# Patient Record
Sex: Male | Born: 1990 | Race: White | Hispanic: No | Marital: Married | Smoking: Never smoker
Health system: Southern US, Community
[De-identification: ages and names within clinical notes are randomized; demographics above are authoritative.]

## PROBLEM LIST (undated history)

## (undated) DIAGNOSIS — Z9109 Other allergy status, other than to drugs and biological substances: Secondary | ICD-10-CM

## (undated) HISTORY — PX: KNEE SURGERY: SHX244

---

## 2012-11-07 ENCOUNTER — Encounter (HOSPITAL_BASED_OUTPATIENT_CLINIC_OR_DEPARTMENT_OTHER): Payer: Self-pay | Admitting: *Deleted

## 2012-11-07 ENCOUNTER — Emergency Department (HOSPITAL_BASED_OUTPATIENT_CLINIC_OR_DEPARTMENT_OTHER)
Admission: EM | Admit: 2012-11-07 | Discharge: 2012-11-07 | Disposition: A | Discharge status: Home / Self Care | Payer: Managed Care, Other (non HMO) | Attending: Emergency Medicine | Admitting: Emergency Medicine

## 2012-11-07 ENCOUNTER — Other Ambulatory Visit: Payer: Self-pay

## 2012-11-07 DIAGNOSIS — S39012A Strain of muscle, fascia and tendon of lower back, initial encounter: Secondary | ICD-10-CM

## 2012-11-07 DIAGNOSIS — Y9389 Activity, other specified: Secondary | ICD-10-CM | POA: Insufficient documentation

## 2012-11-07 DIAGNOSIS — Y9289 Other specified places as the place of occurrence of the external cause: Secondary | ICD-10-CM | POA: Insufficient documentation

## 2012-11-07 DIAGNOSIS — IMO0002 Reserved for concepts with insufficient information to code with codable children: Secondary | ICD-10-CM | POA: Insufficient documentation

## 2012-11-07 MED ORDER — CYCLOBENZAPRINE HCL 10 MG PO TABS: 10.0000 mg | ORAL_TABLET | Freq: Two times a day (BID) | ORAL | Status: DC | PRN

## 2012-11-07 MED ORDER — IBUPROFEN 800 MG PO TABS
800.0000 mg | ORAL_TABLET | Freq: Once | ORAL | Status: AC
  Administered 2012-11-07: 800 mg via ORAL
  Filled 2012-11-07: qty 1
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
## 2012-11-07 NOTE — ED Provider Notes (Signed)
Medical screening examination/treatment/procedure(s) were performed by non-physician practitioner and as supervising physician I was immediately available for consultation/collaboration.   Charles B. Sheldon, MD 11/07/12 2118 

## 2012-11-21 ENCOUNTER — Emergency Department (HOSPITAL_BASED_OUTPATIENT_CLINIC_OR_DEPARTMENT_OTHER): Payer: Managed Care, Other (non HMO)

## 2012-11-21 ENCOUNTER — Encounter (HOSPITAL_BASED_OUTPATIENT_CLINIC_OR_DEPARTMENT_OTHER): Payer: Self-pay | Admitting: *Deleted

## 2012-11-21 ENCOUNTER — Emergency Department (HOSPITAL_BASED_OUTPATIENT_CLINIC_OR_DEPARTMENT_OTHER)
Admission: EM | Admit: 2012-11-21 | Discharge: 2012-11-21 | Disposition: A | Discharge status: Home / Self Care | Payer: Managed Care, Other (non HMO) | Attending: Emergency Medicine | Admitting: Emergency Medicine

## 2012-11-21 DIAGNOSIS — R059 Cough, unspecified: Secondary | ICD-10-CM | POA: Insufficient documentation

## 2012-11-21 DIAGNOSIS — N453 Epididymo-orchitis: Secondary | ICD-10-CM | POA: Insufficient documentation

## 2012-11-21 DIAGNOSIS — N451 Epididymitis: Secondary | ICD-10-CM

## 2012-11-21 DIAGNOSIS — R05 Cough: Secondary | ICD-10-CM | POA: Insufficient documentation

## 2012-11-21 DIAGNOSIS — R109 Unspecified abdominal pain: Secondary | ICD-10-CM | POA: Insufficient documentation

## 2012-11-21 LAB — URINALYSIS, ROUTINE W REFLEX MICROSCOPIC
Protein, ur: NEGATIVE mg/dL
Specific Gravity, Urine: 1.018 (ref 1.005–1.030)
Urobilinogen, UA: 0.2 mg/dL (ref 0.0–1.0)

## 2012-11-21 LAB — URINE MICROSCOPIC-ADD ON

## 2012-11-21 IMAGING — US US SCROTUM
1 series · 14 of 25 positions shown · non-contrast
Comparison: none

CLINICAL DATA: Right testicle pain.  Rule out torsion

SCROTAL ULTRASOUND
DOPPLER ULTRASOUND OF THE TESTICLES
TECHNIQUE: Complete ultrasound examination of the testicles,
epididymis, and other scrotal structures was performed.  Color and
spectral Doppler ultrasound were also utilized to evaluate blood
flow to the testicles.

[Series 1: us scrotum · 0.08mm/px · 14 of 31 slices shown]
[im 1/31]
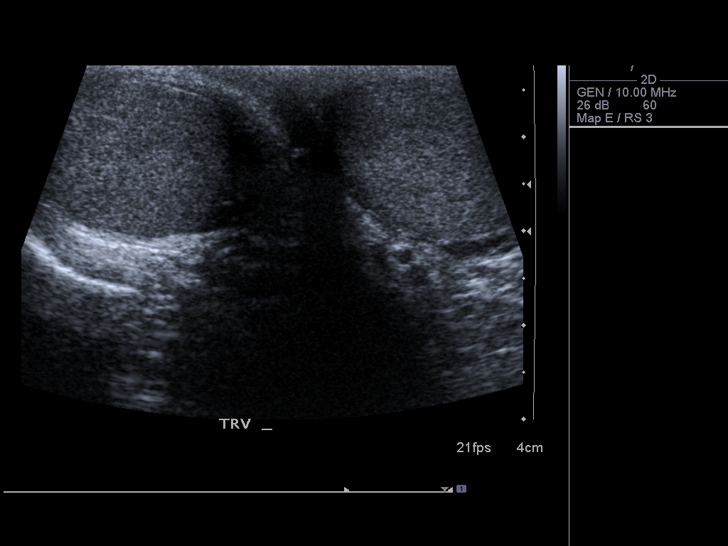
[im 3/31]
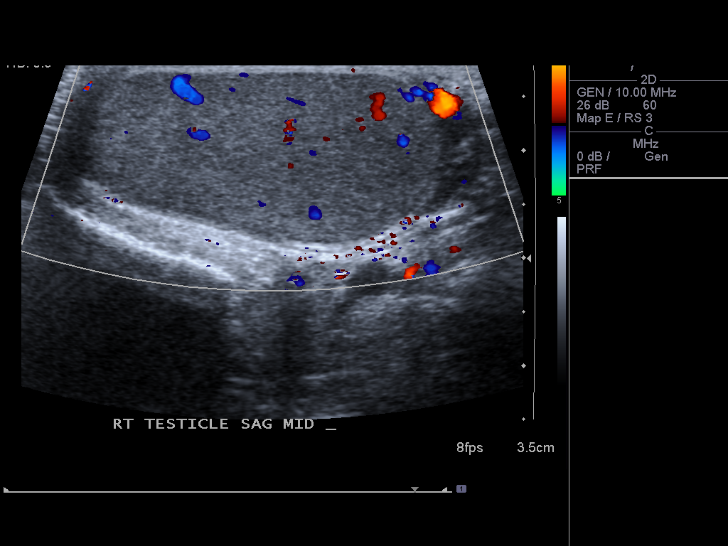
[im 6/31]
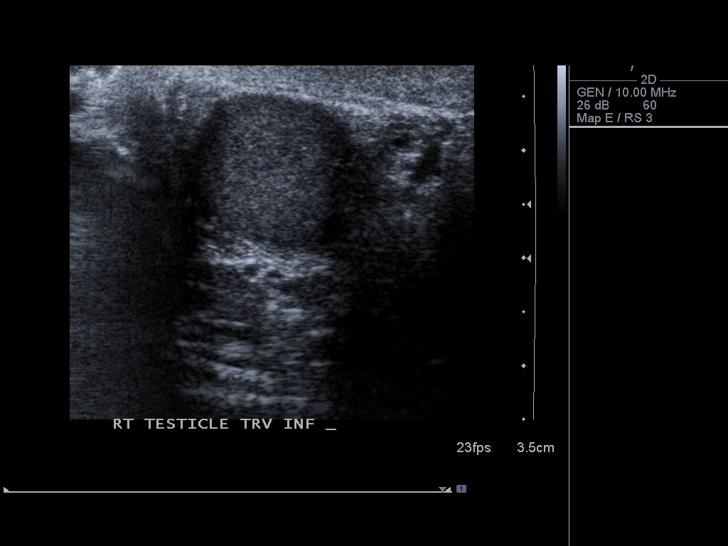
[im 8/31]
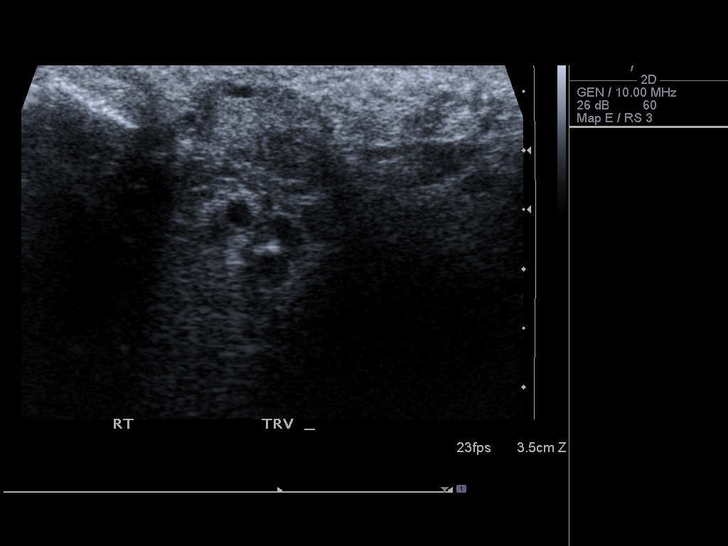
[im 11/31]
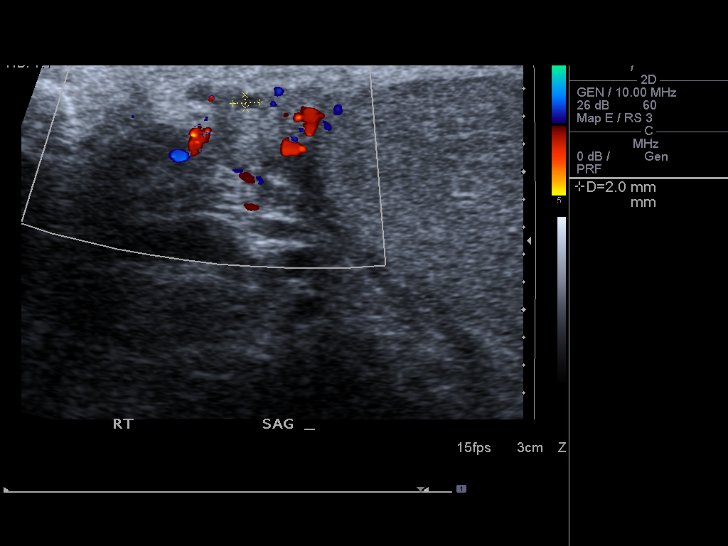
[im 12/31]
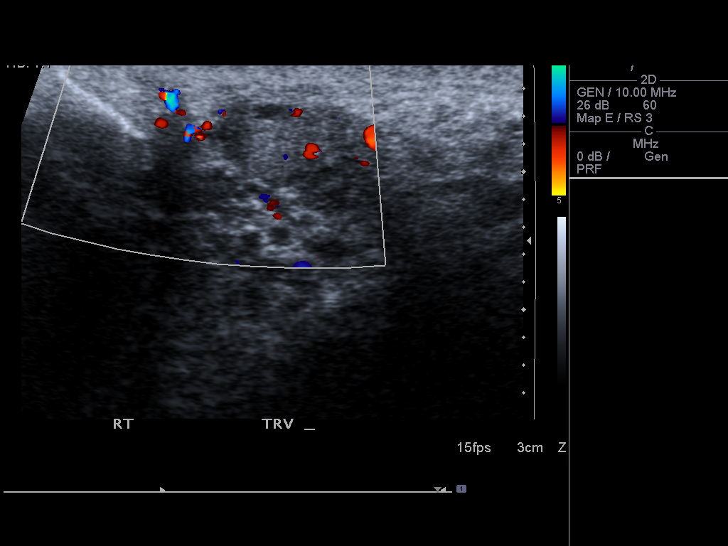
[im 14/31]
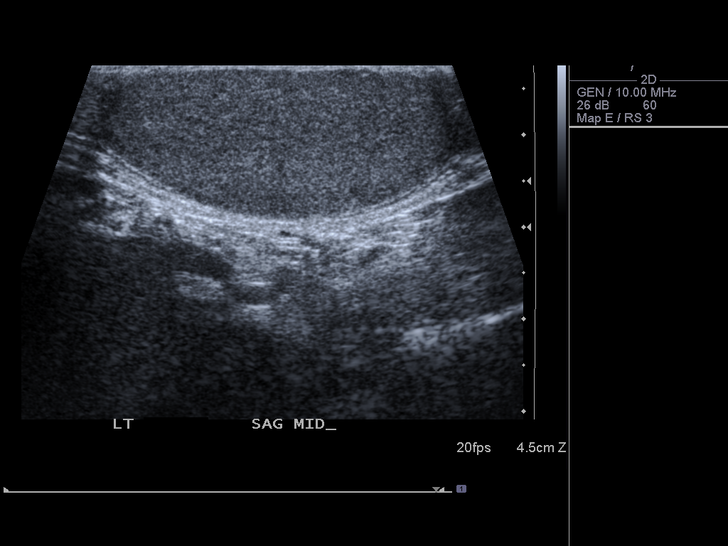
[im 17/31]
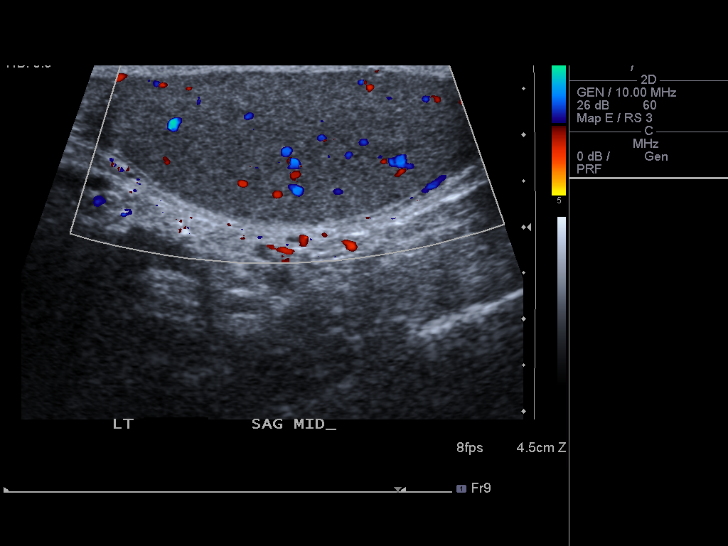
[im 19/31]
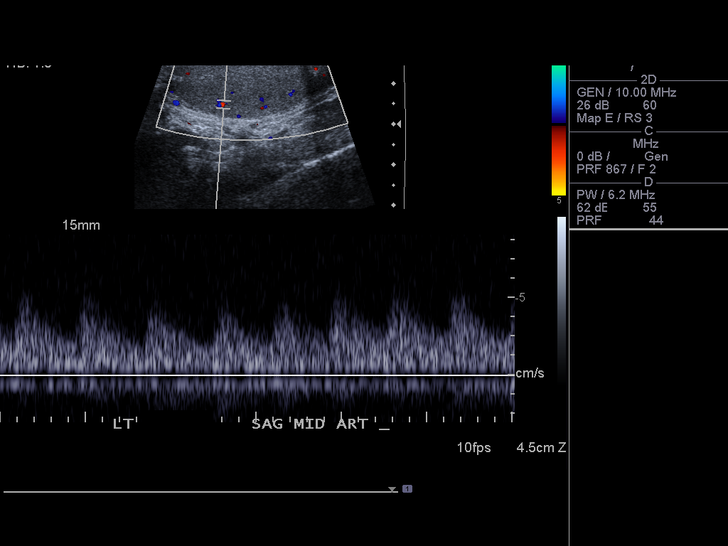
[im 21/31]
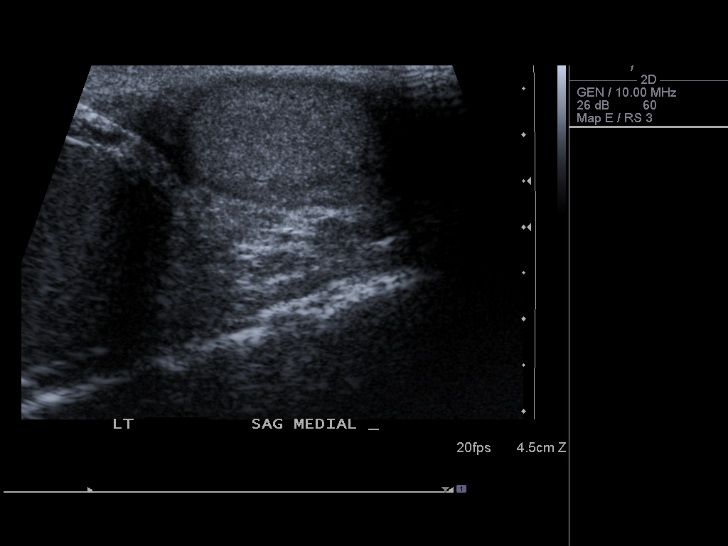
[im 23/31]
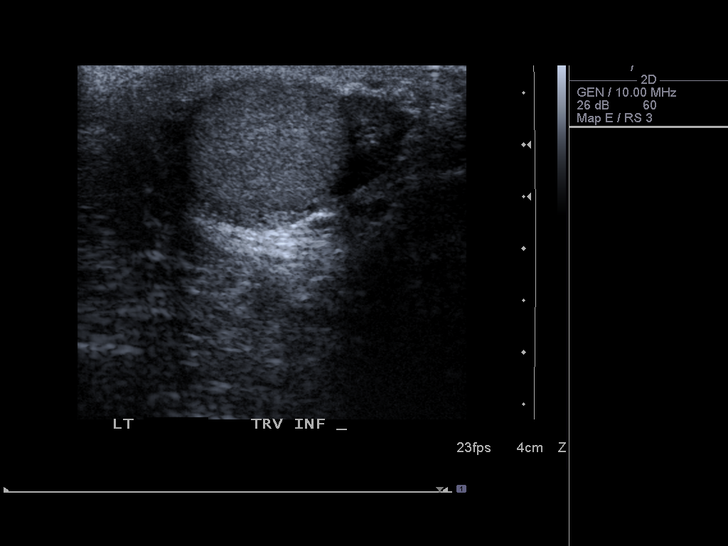
[im 26/31]
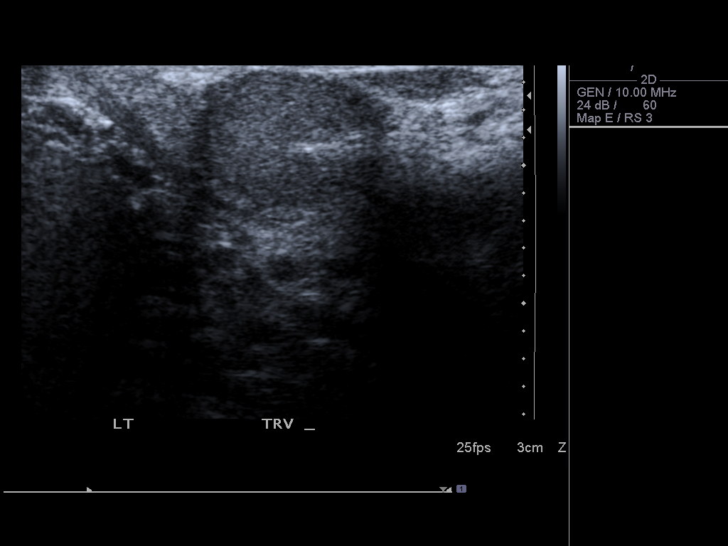
[im 28/31]
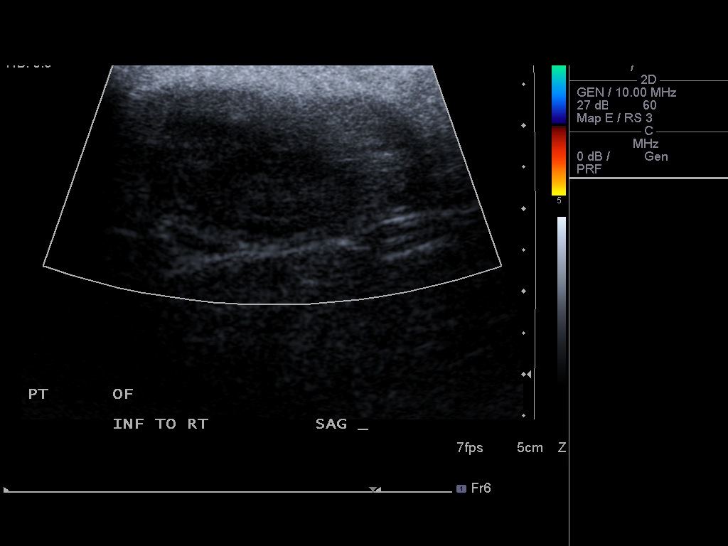
[im 31/31]
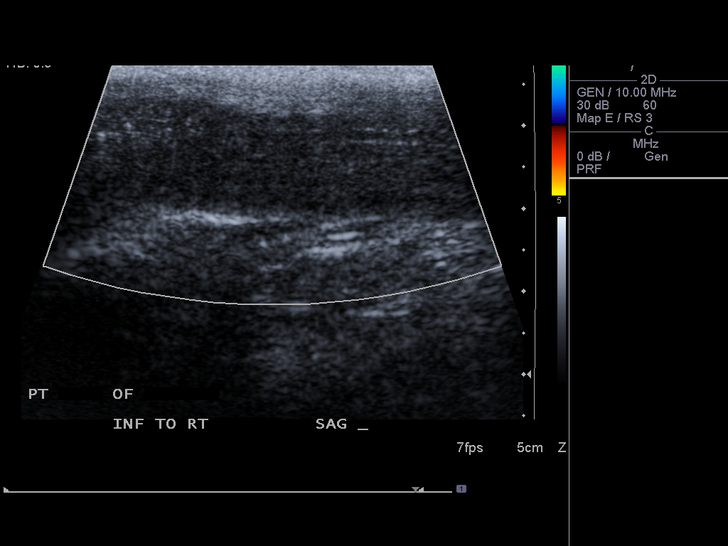

[14 of 25 positions shown; findings below may reference images not displayed]

FINDINGS: Right testis:  3.5 x 1.6 x 2.8 cm.  No mass lesion.  Normal blood
flow by Doppler.

Left testis:  3.8 x 1.7 x 2.8 cm.  Negative for mass.  Normal blood
flow by Doppler.

Right epididymis:  Normal in size and appearance.

Left epididymis:  Normal in size and appearance.

Hydrocele:  Negative

Varicocele:  Negative

Pulsed Doppler interrogation of both testes demonstrates low
resistance flow bilaterally.
IMPRESSION: Negative

## 2012-11-21 MED ORDER — CIPROFLOXACIN HCL 500 MG PO TABS: 500.0000 mg | ORAL_TABLET | Freq: Two times a day (BID) | ORAL | Status: DC

## 2012-11-21 NOTE — ED Provider Notes (Signed)
History    Scribed for Justin Bucco, MD, the patient was seen in room MH07/MH07. This chart was scribed by Lewanda Rife, ED scribe. Patient's care was started at    [facility]: [id number]  Arrival date & time 11/21/12  1821   First MD Initiated Contact with Patient 11/21/12 1843      Chief Complaint  Patient presents with  . Testicle Pain    (Consider location/radiation/quality/duration/timing/severity/associated sxs/prior treatment) The history is provided by the patient.   Justin Ochoa is a 22 y.o. male who presents to the Emergency Department complaining of constant, moderate and worsening right testicular pain onset acute yesterday 11 pm. Pt reports pain is persistent and aggravated on palpation. Pt states suprapubic tenderness is reproduced when right testicle is palpated. Pt additionally reports mild cough. Pt denies penile discharge, recent injury, rash, penile pain, hematuria, dysuria, and fever. Pt denies hx of hernia. Pt denies being sexually active and no hx of STD's. Pt denies taking any medications at home PTA for pain. Pt reports hx of prostatitis.    History reviewed. No pertinent past medical history.  History reviewed. No pertinent past surgical history.  No family history on file.  History  Substance Use Topics  . Smoking status: Never Smoker   . Smokeless tobacco: Not on file  . Alcohol Use: Yes      Review of Systems  Constitutional: Negative.   HENT: Negative.   Respiratory: Positive for cough.   Cardiovascular: Negative.   Gastrointestinal: Positive for abdominal pain (suprapubic). Negative for vomiting and diarrhea.  Genitourinary: Positive for testicular pain.  Musculoskeletal: Negative.   Skin: Negative.   Neurological: Negative.   Psychiatric/Behavioral: Negative.   All other systems reviewed and are negative.   A complete 10 system review of systems was obtained and all systems are negative except as noted in the HPI and PMH.    Allergies   Review of patient's allergies indicates no known allergies.  Home Medications   Current Outpatient Rx  Name  Route  Sig  Dispense  Refill  . ciprofloxacin (CIPRO) 500 MG tablet   Oral   Take 1 tablet (500 mg total) by mouth 2 (two) times daily. One po bid x 10 days   20 tablet   0   . cyclobenzaprine (FLEXERIL) 10 MG tablet   Oral   Take 1 tablet (10 mg total) by mouth 2 (two) times daily as needed for muscle spasms.   20 tablet   0     BP 136/84  Pulse 80  Temp(Src) 98.2 F (36.8 C) (Oral)  Resp 18  Wt 140 lb (63.504 kg)  SpO2 100%  Physical Exam  Nursing note and vitals reviewed. Constitutional: He is oriented to person, place, and time. He appears well-developed and well-nourished.  HENT:  Head: Normocephalic and atraumatic.  Eyes: Pupils are equal, round, and reactive to light.  Neck: Normal range of motion. Neck supple.  Cardiovascular: Normal rate, regular rhythm and normal heart sounds.   Pulmonary/Chest: Effort normal and breath sounds normal. No respiratory distress. He has no wheezes. He has no rales. He exhibits no tenderness.  Abdominal: Soft. Bowel sounds are normal. There is tenderness (mild suprapubic). There is no rebound and no guarding. Hernia confirmed negative in the right inguinal area and confirmed negative in the left inguinal area.  Genitourinary: Right testis shows swelling (mild) and tenderness. Right testis shows no mass. Cremasteric reflex is not absent on the right side. Left testis shows no mass, no  swelling and no tenderness. Cremasteric reflex is not absent on the left side. Circumcised.  Pain over right epididymus.   Musculoskeletal: Normal range of motion. He exhibits no edema.  Lymphadenopathy:    He has no cervical adenopathy.       Right: No inguinal adenopathy present.       Left: No inguinal adenopathy present.  Neurological: He is alert and oriented to person, place, and time.  Skin: Skin is warm and dry. No rash noted.   Psychiatric: He has a normal mood and affect.    ED Course  Procedures (including critical care time) Medications - No data to display  Results for orders placed during the hospital encounter of 11/21/12  URINALYSIS, ROUTINE W REFLEX MICROSCOPIC      Result Value Range   Color, Urine YELLOW  YELLOW   APPearance CLEAR  CLEAR   Specific Gravity, Urine 1.018  1.005 - 1.030   pH 6.5  5.0 - 8.0   Glucose, UA NEGATIVE  NEGATIVE mg/dL   Hgb urine dipstick NEGATIVE  NEGATIVE   Bilirubin Urine NEGATIVE  NEGATIVE   Ketones, ur NEGATIVE  NEGATIVE mg/dL   Protein, ur NEGATIVE  NEGATIVE mg/dL   Urobilinogen, UA 0.2  0.0 - 1.0 mg/dL   Nitrite NEGATIVE  NEGATIVE   Leukocytes, UA TRACE (*) NEGATIVE  URINE MICROSCOPIC-ADD ON      Result Value Range   Squamous Epithelial / LPF RARE  RARE   WBC, UA 0-2  <3 WBC/hpf   Bacteria, UA RARE  RARE   US Scrotum  11/21/2012   *RADIOLOGY REPORT*  Clinical Data:  Right testicle pain.  Rule out torsion  SCROTAL ULTRASOUND DOPPLER ULTRASOUND OF THE TESTICLES  Technique: Complete ultrasound examination of the testicles, epididymis, and other scrotal structures was performed.  Color and spectral Doppler ultrasound were also utilized to evaluate blood flow to the testicles.  Comparison:  none  Findings:  Right testis:  3.5 x 1.6 x 2.8 cm.  No mass lesion.  Normal blood flow by Doppler.  Left testis:  3.8 x 1.7 x 2.8 cm.  Negative for mass.  Normal blood flow by Doppler.  Right epididymis:  Normal in size and appearance.  Left epididymis:  Normal in size and appearance.  Hydrocele:  Negative  Varicocele:  Negative  Pulsed Doppler interrogation of both testes demonstrates low resistance flow bilaterally.  IMPRESSION: Negative   Original Report Authenticated By: Janeece Riggers, M.D.    [place] Art/ven Flow Abd Pelv Doppler  11/21/2012   *RADIOLOGY REPORT*  Clinical Data:  Right testicle pain.  Rule out torsion  SCROTAL ULTRASOUND DOPPLER ULTRASOUND OF THE TESTICLES  Technique:  Complete ultrasound examination of the testicles, epididymis, and other scrotal structures was performed.  Color and spectral Doppler ultrasound were also utilized to evaluate blood flow to the testicles.  Comparison:  none  Findings:  Right testis:  3.5 x 1.6 x 2.8 cm.  No mass lesion.  Normal blood flow by Doppler.  Left testis:  3.8 x 1.7 x 2.8 cm.  Negative for mass.  Normal blood flow by Doppler.  Right epididymis:  Normal in size and appearance.  Left epididymis:  Normal in size and appearance.  Hydrocele:  Negative  Varicocele:  Negative  Pulsed Doppler interrogation of both testes demonstrates low resistance flow bilaterally.  IMPRESSION: Negative   Original Report Authenticated By: Janeece Riggers, M.D.       1. Epididymitis       MDM  No  evidence of torsion.  Pt says that he is allergic to doxycycline.  Denies being sexually active.  Will tx with cipro.  Advised to f/u with urologist if symptoms not improving    I personally performed the services described in this documentation, which was scribed in my presence.  The recorded information has been reviewed and considered.      Justin Bucco, MD 11/21/12 2008

## 2012-11-21 NOTE — ED Notes (Signed)
Right testicular pain since yesterday.

## 2015-05-15 ENCOUNTER — Emergency Department (INDEPENDENT_AMBULATORY_CARE_PROVIDER_SITE_OTHER): Payer: Managed Care, Other (non HMO)

## 2015-05-15 ENCOUNTER — Encounter (HOSPITAL_COMMUNITY): Payer: Self-pay | Admitting: Emergency Medicine

## 2015-05-15 ENCOUNTER — Emergency Department (HOSPITAL_COMMUNITY)
Admission: EM | Admit: 2015-05-15 | Discharge: 2015-05-15 | Disposition: A | Discharge status: Home / Self Care | Payer: Managed Care, Other (non HMO) | Source: Home / Self Care | Attending: Emergency Medicine | Admitting: Emergency Medicine

## 2015-05-15 DIAGNOSIS — R109 Unspecified abdominal pain: Secondary | ICD-10-CM

## 2015-05-15 DIAGNOSIS — K5900 Constipation, unspecified: Secondary | ICD-10-CM

## 2015-05-15 LAB — POCT URINALYSIS DIP (DEVICE)
Bilirubin Urine: NEGATIVE
GLUCOSE, UA: NEGATIVE mg/dL
Hgb urine dipstick: NEGATIVE
Ketones, ur: NEGATIVE mg/dL
LEUKOCYTES UA: NEGATIVE
Nitrite: NEGATIVE
Protein, ur: NEGATIVE mg/dL
SPECIFIC GRAVITY, URINE: 1.01 (ref 1.005–1.030)
UROBILINOGEN UA: 0.2 mg/dL (ref 0.0–1.0)
pH: 7 (ref 5.0–8.0)

## 2015-05-15 IMAGING — DX DG ABDOMEN 1V
1 series · 1 of 1 positions shown · non-contrast
Comparison: None.

CLINICAL DATA: Left lower quadrant pain for 3 days

EXAM:
ABDOMEN - 1 VIEW

[abdomen kub]
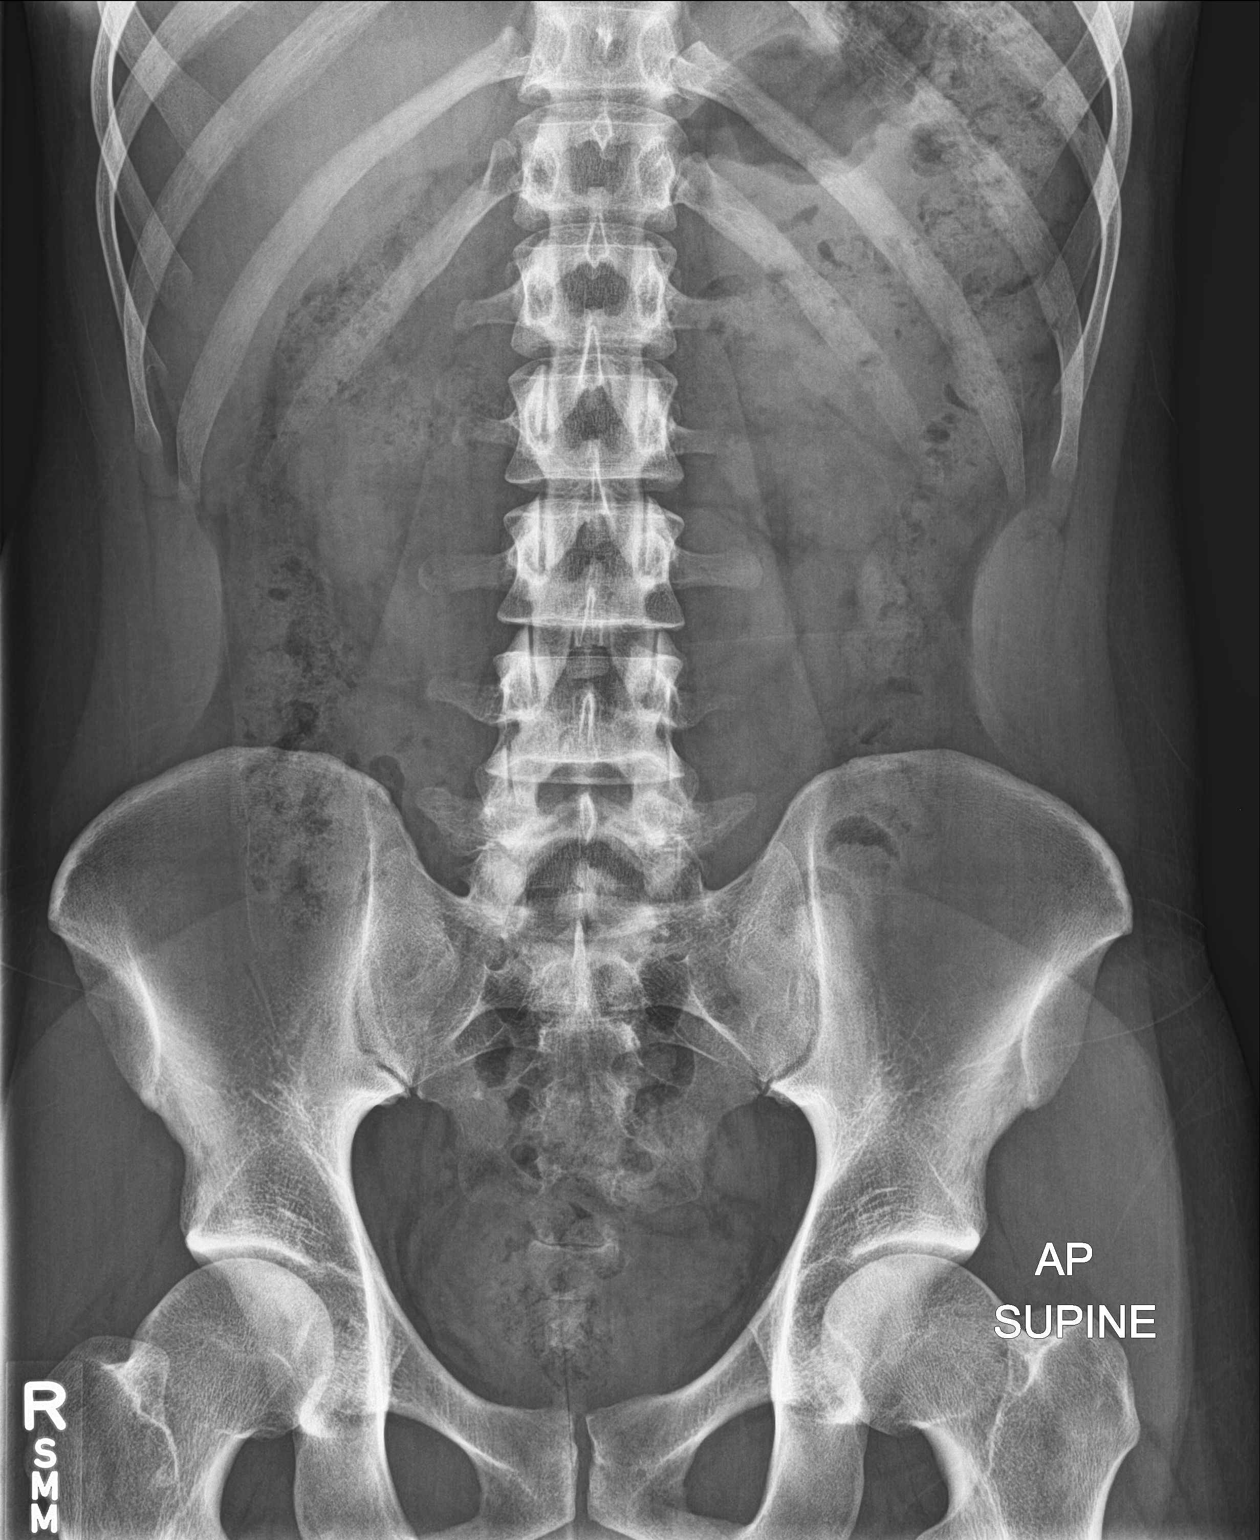

[1 of 1 positions shown; findings below may reference images not displayed]

FINDINGS: Stool throughout the colon. No abnormally dilated loops of bowel. No
abnormal opacities.
IMPRESSION: Constipation

## 2015-05-15 MED ORDER — IBUPROFEN 800 MG PO TABS: 800.0000 mg | ORAL_TABLET | Freq: Three times a day (TID) | ORAL | Status: DC | PRN

## 2015-05-15 MED ORDER — KETOROLAC TROMETHAMINE 60 MG/2ML IM SOLN
INTRAMUSCULAR | Status: AC
  Filled 2015-05-15: qty 2

## 2015-05-15 MED ORDER — POLYETHYLENE GLYCOL 3350 17 GM/SCOOP PO POWD
17.0000 g | Freq: Three times a day (TID) | ORAL | Status: DC
Start: 2015-05-15 — End: 2022-07-28

## 2015-05-15 MED ORDER — KETOROLAC TROMETHAMINE 60 MG/2ML IM SOLN
60.0000 mg | Freq: Once | INTRAMUSCULAR | Status: AC
  Administered 2015-05-15: 60 mg via INTRAMUSCULAR

## 2015-05-15 NOTE — Discharge Instructions (Signed)
The x-ray shows significant constipation. This may be causing your pain. Take ibuprofen 800 mg 3 times a day as needed for pain. This will not worsen constipation. Take MiraLAX 3 times a day until you are having diarrhea. If the constipation is causing your pain, you should see improvement in the next 2 days. If your symptoms do not improve or your pain acutely worsens, please go to the emergency room.

## 2015-05-15 NOTE — ED Notes (Signed)
Pt has been suffering from left sided back pain since Monday.  He states the pain will come and go, but it is getting progressively worse.  He denies any injury or fever.

## 2015-05-15 NOTE — ED Provider Notes (Signed)
CSN: [id number]     Arrival date & time 05/15/15  1940 History   First MD Initiated Contact with Patient 05/15/15 1957     Chief Complaint  Patient presents with  . Back Pain   (Consider location/radiation/quality/duration/timing/severity/associated sxs/prior Treatment) HPI  He is a 24 year old man here for evaluation of left back pain. He states on Monday evening he had an episode of left-sided back pain. It resolved overnight. Then, today around 5 PM it came back. He states it is more intense this time. It is always uncomfortable, but will have waves of more severe pain that last a few seconds. He states it feels like he has to move around, but cannot get comfortable. Pain is not worse with movement. He states he can press in that area without discomfort. The pain wraps around to the left side. He denies any abdominal pain. He denies any nausea or vomiting. No pain with urination or hematuria. He does report increased urinary frequency. No fevers or chills.  He denies any injury or trauma. No new activities or heavy lifting. He denies any history of constipation. Last bowel movement was yesterday and described as normal.  History reviewed. No pertinent past medical history. History reviewed. No pertinent past surgical history. History reviewed. No pertinent family history. Social History  Substance Use Topics  . Smoking status: Never Smoker   . Smokeless tobacco: None  . Alcohol Use: Yes    Review of Systems As in history of present illness Allergies  Review of patient's allergies indicates no known allergies.  Home Medications   Prior to Admission medications   Medication Sig Start Date End Date Taking? Authorizing Provider  ibuprofen (ADVIL,MOTRIN) 800 MG tablet Take 1 tablet (800 mg total) by mouth every 8 (eight) hours as needed for moderate pain. 05/15/15   Charm Rings, MD  polyethylene glycol powder (GLYCOLAX/MIRALAX) powder Take 17 g by mouth 3 (three) times daily. 05/15/15   Charm Rings, MD   Meds Ordered and Administered this Visit   Medications  ketorolac (TORADOL) injection 60 mg (not administered)    BP 148/93 mmHg  Pulse 73  Temp(Src) 98.2 F (36.8 C) (Oral)  Resp 16  SpO2 100% No data found.   Physical Exam  Constitutional: He is oriented to person, place, and time. He appears well-developed and well-nourished. He appears distressed (appears a little uncomfortable).  Neck: Neck supple.  Cardiovascular: Normal rate.   Pulmonary/Chest: Effort normal.  Abdominal: Soft. Bowel sounds are normal. He exhibits no distension and no mass. There is no tenderness. There is no rebound and no guarding.  Musculoskeletal:  Back: No erythema or edema. No vertebral tenderness or step-offs. No point tenderness or muscle spasm appreciated.  Neurological: He is alert and oriented to person, place, and time.    ED Course  Procedures (including critical care time)  Labs Review Labs Reviewed  POCT URINALYSIS DIP (DEVICE)    Imaging Review Dg Abd 1 View  05/15/2015   CLINICAL DATA:  Left lower quadrant pain for 3 days  EXAM: ABDOMEN - 1 VIEW  COMPARISON:  None.  FINDINGS: Stool throughout the colon. No abnormally dilated loops of bowel. No abnormal opacities.  IMPRESSION: Constipation   Electronically Signed   By: Esperanza Heir M.D.   On: 05/15/2015 20:51     MDM   1. Left flank pain   2. Constipation, unspecified constipation type    X-ray shows constipation.  No hematuria or radiopaque body to suggest kidney  stone. No injury or palpable tenderness to suggest musculoskeletal etiology. We'll treat with MiraLAX and ibuprofen. Toradol given here. If symptoms do not improve in the next 2 days or acutely worsen, he will go to the emergency room.    Charm Rings, MD 05/15/15 2107

## 2016-09-22 DIAGNOSIS — R1031 Right lower quadrant pain: Secondary | ICD-10-CM | POA: Diagnosis not present

## 2016-10-21 ENCOUNTER — Ambulatory Visit
Admission: RE | Admit: 2016-10-21 | Discharge: 2016-10-21 | Disposition: A | Discharge status: Home / Self Care | Payer: 59 | Source: Ambulatory Visit | Attending: Family Medicine | Admitting: Family Medicine

## 2016-10-21 ENCOUNTER — Other Ambulatory Visit: Payer: Self-pay | Admitting: Family Medicine

## 2016-10-21 DIAGNOSIS — R1031 Right lower quadrant pain: Secondary | ICD-10-CM | POA: Diagnosis not present

## 2016-10-21 IMAGING — CT CT ABD-PELV W/ CM
3 of 4 series · 12 of 36 positions shown, 19 images · IV contrast (READICAT/WATER & [ID] ISOVUE 300)
Comparison: None.

CLINICAL DATA: Right lower quadrant pain for 2.5 months.

EXAM:
CT ABDOMEN AND PELVIS WITH CONTRAST
TECHNIQUE: Multidetector CT imaging of the abdomen and pelvis was performed
using the standard protocol following bolus administration of
intravenous contrast.
CONTRAST:  100 mL [1Z]

[Series 3: abd/pelvis with · axial · 0.70mm/px · z∈[-422,-87]mm · 9 of 85 slices shown, 15 images]
[im 9/85  soft-tissue]
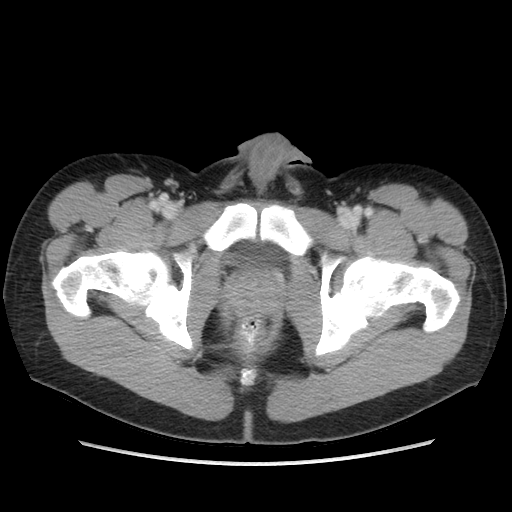
[im 9/85  bone]
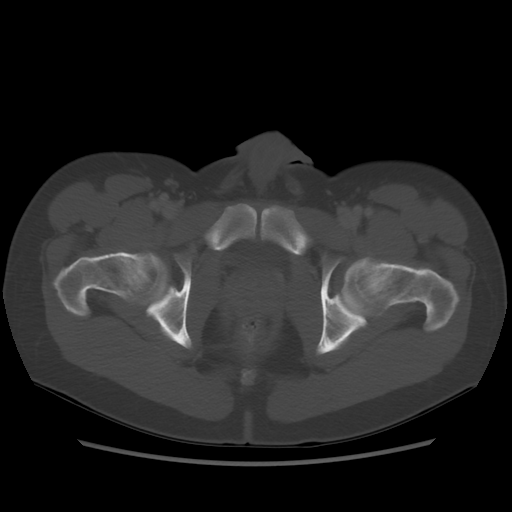
[im 17/85  soft-tissue]
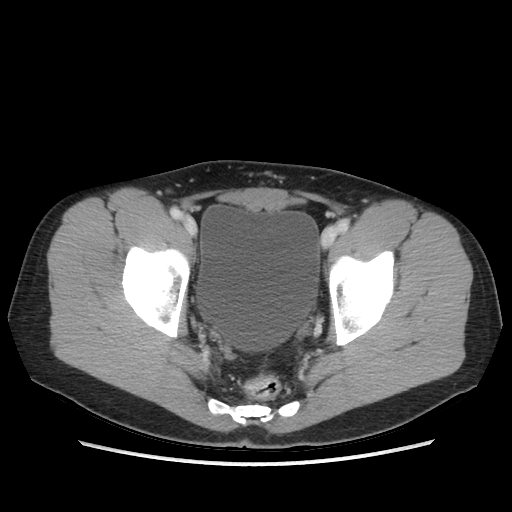
[im 26/85  soft-tissue]
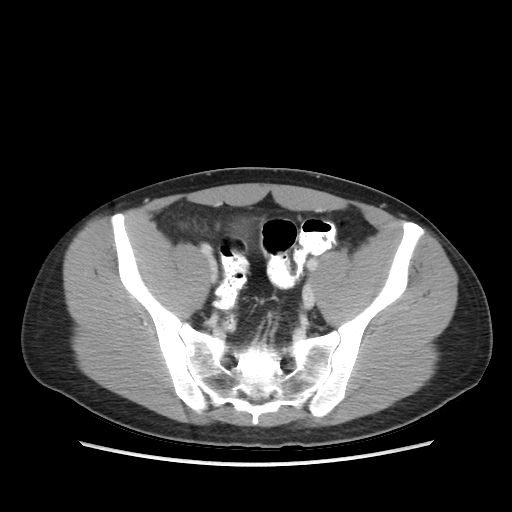
[im 34/85  soft-tissue]
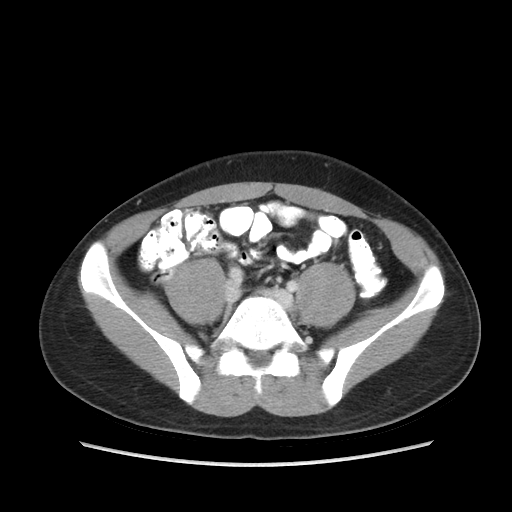
[im 43/85  soft-tissue]
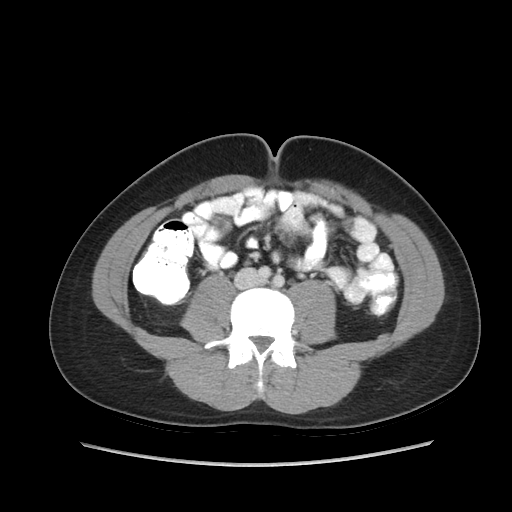
[im 51/85  soft-tissue]
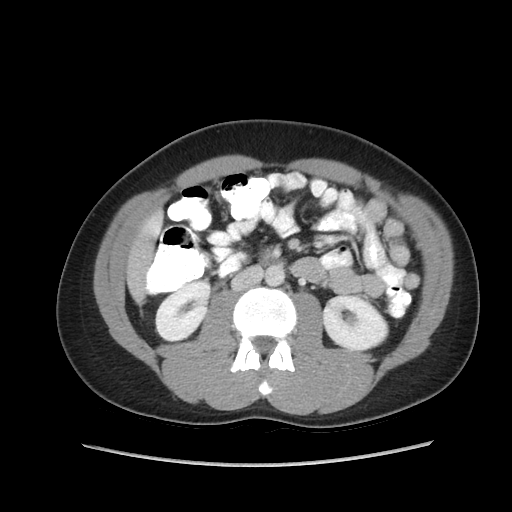
[im 51/85  lung]
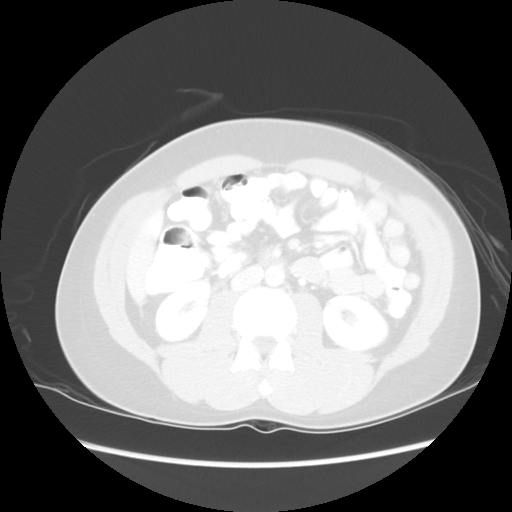
[im 59/85  soft-tissue]
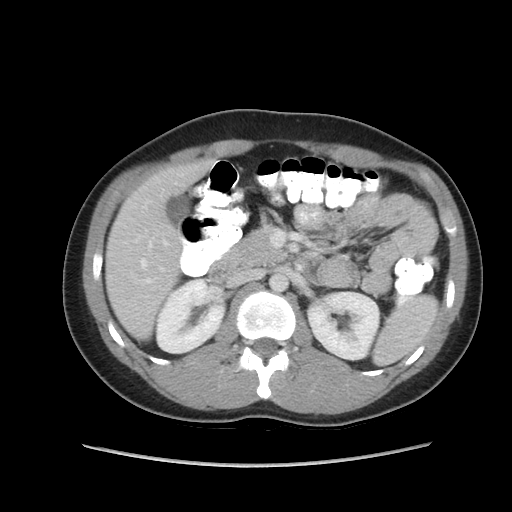
[im 59/85  lung]
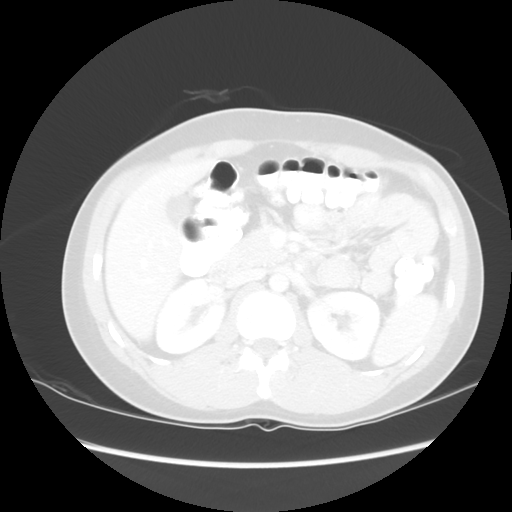
[im 68/85  soft-tissue]
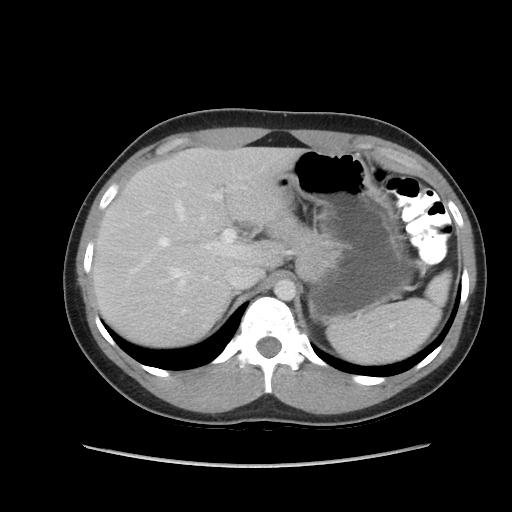
[im 68/85  lung]
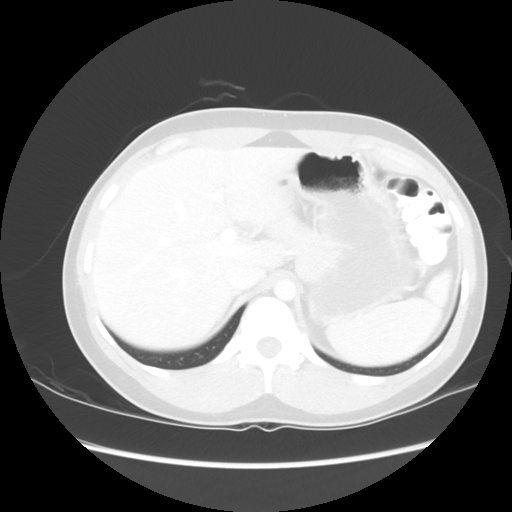
[im 76/85  soft-tissue]
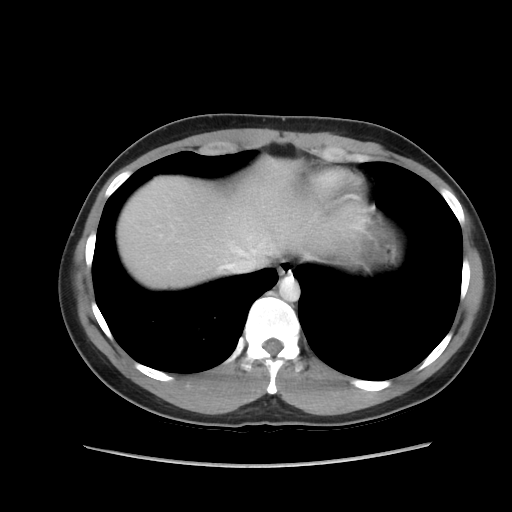
[im 76/85  lung]
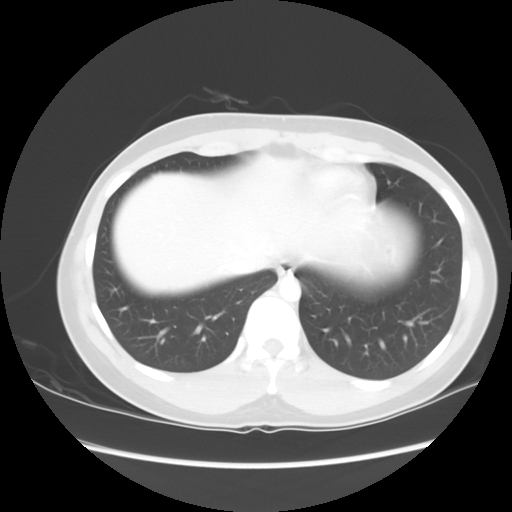
[im 76/85  bone]
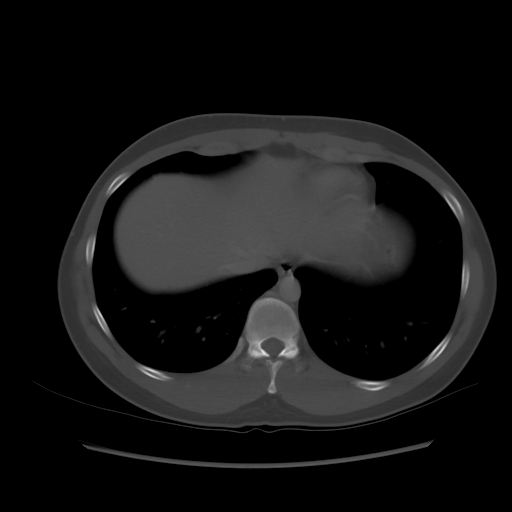

[Series 601: coronal body · coronal · 0.87mm/px · 1 of 99 slices shown, 2 images]
[im 33/99  soft-tissue]
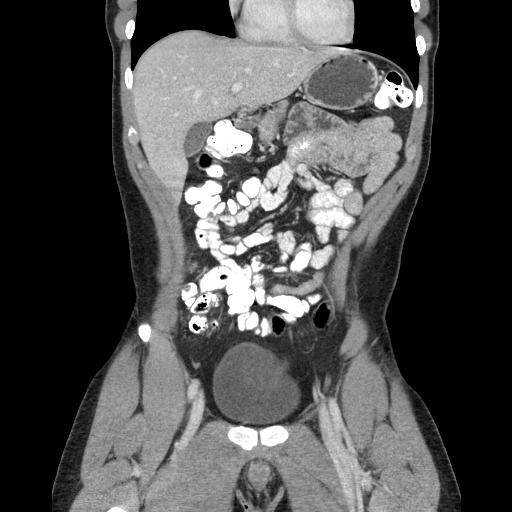
[im 33/99  bone]
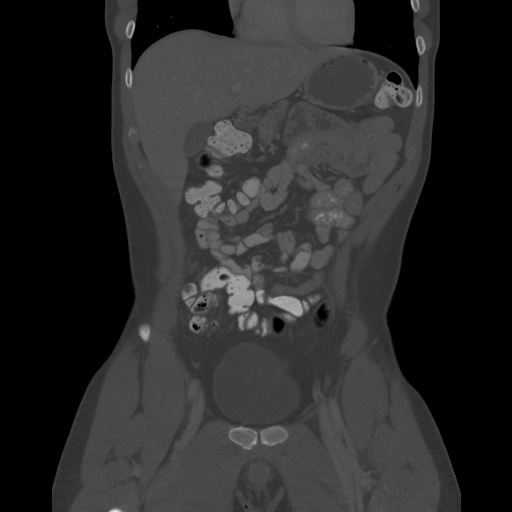

[Series 602: sagittal body · sagittal · 0.87mm/px · 2 of 145 slices shown]
[im 17/145  soft-tissue]
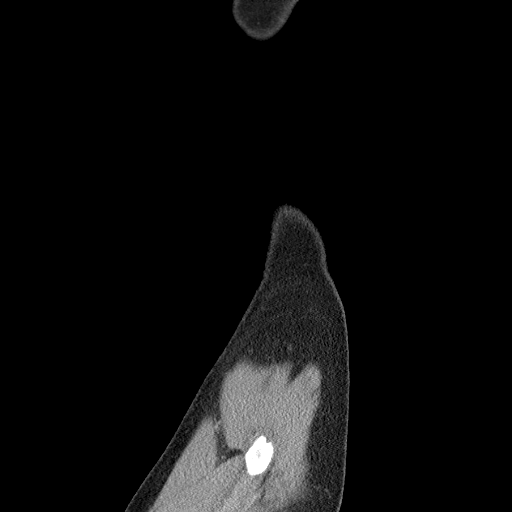
[im 34/145  soft-tissue]
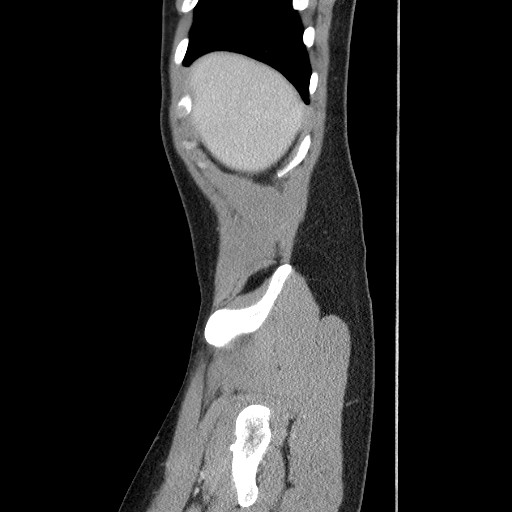

[12 of 36 positions shown; findings below may reference images not displayed]

FINDINGS: Lower chest: No acute abnormality.

Hepatobiliary: No focal liver abnormality is seen. No gallstones,
gallbladder wall thickening, or biliary dilatation.

Pancreas: Unremarkable. No pancreatic ductal dilatation or
surrounding inflammatory changes.

Spleen: Normal in size without focal abnormality.

Adrenals/Urinary Tract: Adrenal glands are unremarkable. Kidneys are
normal, without renal calculi, focal lesion, or hydronephrosis.
Bladder is unremarkable.

Stomach/Bowel: Stomach is within normal limits. Appendix appears
normal. No evidence of bowel wall thickening, distention, or
inflammatory changes.

Vascular/Lymphatic: No significant vascular findings are present. No
enlarged abdominal or pelvic lymph nodes.

Reproductive: Prostate is unremarkable.

Other: No abdominal wall hernia or abnormality. No abdominopelvic
ascites.

Musculoskeletal: No acute or significant osseous findings.
IMPRESSION: 1. No acute abdominal or pelvic pathology.

## 2016-10-21 MED ORDER — IOPAMIDOL (ISOVUE-300) INJECTION 61%
100.0000 mL | Freq: Once | INTRAVENOUS | Status: AC | PRN
  Administered 2016-10-21: 100 mL via INTRAVENOUS

## 2016-11-04 DIAGNOSIS — R1031 Right lower quadrant pain: Secondary | ICD-10-CM | POA: Diagnosis not present

## 2016-11-04 DIAGNOSIS — M545 Low back pain: Secondary | ICD-10-CM | POA: Diagnosis not present

## 2016-12-15 DIAGNOSIS — H40023 Open angle with borderline findings, high risk, bilateral: Secondary | ICD-10-CM | POA: Diagnosis not present

## 2016-12-22 DIAGNOSIS — Z13220 Encounter for screening for lipoid disorders: Secondary | ICD-10-CM | POA: Diagnosis not present

## 2016-12-22 DIAGNOSIS — R109 Unspecified abdominal pain: Secondary | ICD-10-CM | POA: Diagnosis not present

## 2016-12-22 DIAGNOSIS — Z0000 Encounter for general adult medical examination without abnormal findings: Secondary | ICD-10-CM | POA: Diagnosis not present

## 2017-02-22 DIAGNOSIS — H40023 Open angle with borderline findings, high risk, bilateral: Secondary | ICD-10-CM | POA: Diagnosis not present

## 2017-06-03 DIAGNOSIS — Z23 Encounter for immunization: Secondary | ICD-10-CM | POA: Diagnosis not present

## 2017-06-10 DIAGNOSIS — I499 Cardiac arrhythmia, unspecified: Secondary | ICD-10-CM | POA: Diagnosis not present

## 2017-06-10 DIAGNOSIS — R42 Dizziness and giddiness: Secondary | ICD-10-CM | POA: Diagnosis not present

## 2017-09-01 DIAGNOSIS — J329 Chronic sinusitis, unspecified: Secondary | ICD-10-CM | POA: Diagnosis not present

## 2018-01-04 DIAGNOSIS — J309 Allergic rhinitis, unspecified: Secondary | ICD-10-CM | POA: Diagnosis not present

## 2018-01-04 DIAGNOSIS — R05 Cough: Secondary | ICD-10-CM | POA: Diagnosis not present

## 2018-08-10 DIAGNOSIS — Z23 Encounter for immunization: Secondary | ICD-10-CM | POA: Diagnosis not present

## 2018-09-16 DIAGNOSIS — J019 Acute sinusitis, unspecified: Secondary | ICD-10-CM | POA: Diagnosis not present

## 2018-11-01 DIAGNOSIS — S0502XA Injury of conjunctiva and corneal abrasion without foreign body, left eye, initial encounter: Secondary | ICD-10-CM | POA: Diagnosis not present

## 2020-04-05 ENCOUNTER — Other Ambulatory Visit: Payer: 59

## 2020-08-12 ENCOUNTER — Ambulatory Visit: Payer: 59 | Attending: Internal Medicine

## 2020-08-12 DIAGNOSIS — Z23 Encounter for immunization: Secondary | ICD-10-CM

## 2020-08-12 NOTE — Progress Notes (Signed)
   Covid-19 Vaccination Clinic  Name:  Justin Ochoa    MRN: [id number] DOB: [birth year]  08/12/2020  Justin Ochoa was observed post Covid-19 immunization for 15 minutes without incident. He was provided with Vaccine Information Sheet and instruction to access the V-Safe system.   Justin Ochoa was instructed to call 911 with any severe reactions post vaccine: Marland Kitchen Difficulty breathing  . Swelling of face and throat  . A fast heartbeat  . A bad rash all over body  . Dizziness and weakness   Immunizations Administered    Name Date Dose VIS Date Route   Pfizer COVID-19 Vaccine 08/12/2020  2:33 PM 0.3 mL 06/26/2020 Intramuscular   Manufacturer: [employer], [employer]   Lot: [id number]   NDC: [address]-3

## 2021-12-10 ENCOUNTER — Ambulatory Visit (INDEPENDENT_AMBULATORY_CARE_PROVIDER_SITE_OTHER): Payer: 59 | Admitting: Orthopaedic Surgery

## 2021-12-10 ENCOUNTER — Ambulatory Visit (INDEPENDENT_AMBULATORY_CARE_PROVIDER_SITE_OTHER): Payer: 59

## 2021-12-10 ENCOUNTER — Encounter: Payer: Self-pay | Admitting: Orthopaedic Surgery

## 2021-12-10 DIAGNOSIS — M542 Cervicalgia: Secondary | ICD-10-CM | POA: Diagnosis not present

## 2021-12-10 DIAGNOSIS — M549 Dorsalgia, unspecified: Secondary | ICD-10-CM | POA: Diagnosis not present

## 2021-12-10 MED ORDER — PREDNISONE 5 MG (21) PO TBPK: ORAL_TABLET | ORAL | 0 refills | Status: DC

## 2021-12-10 MED ORDER — METHOCARBAMOL 750 MG PO TABS: 750.0000 mg | ORAL_TABLET | Freq: Three times a day (TID) | ORAL | 1 refills | Status: DC | PRN

## 2021-12-10 NOTE — Progress Notes (Signed)
? ?Office Visit Note ?  ?Patient: Justin Ochoa           ?Date of Birth: 07/07/1991           ?MRN: [id number] ?Visit Date: 12/10/2021 ?             ?Requested by: No referring provider defined for this encounter. ?PCP: Patient, No Pcp Per (Inactive) ? ? ?Assessment & Plan: ?Visit Diagnoses:  ?1. Upper back pain   ?2. Neck pain   ? ? ?Plan: Impression is cervical and thoracic spine strains.  At this point, I recommended steroids, muscle relaxers and heating pad.  Of also discussed sending in a referral for physical therapy once his symptoms have improved.  He is agreeable to this plan.  If his symptoms do not improve he will let us know.  Follow-up with [place] as needed. ? ?Follow-Up Instructions: Return if symptoms worsen or fail to improve.  ? ?Orders:  ?Orders Placed This Encounter  ?Procedures  ? XR Cervical Spine 2 or 3 views  ? XR Thoracic Spine 2 View  ? Ambulatory referral to Physical Therapy  ? ?Meds ordered this encounter  ?Medications  ? predniSONE (STERAPRED UNI-PAK 21 TAB) 5 MG (21) TBPK tablet  ?  Sig: Take as directed  ?  Dispense:  21 tablet  ?  Refill:  0  ? methocarbamol (ROBAXIN) 750 MG tablet  ?  Sig: Take 1 tablet (750 mg total) by mouth every 8 (eight) hours as needed for muscle spasms.  ?  Dispense:  20 tablet  ?  Refill:  1  ? ? ? ? Procedures: ?No procedures performed ? ? ?Clinical Data: ?No additional findings. ? ? ?Subjective: ?Chief Complaint  ?Patient presents with  ? Neck - Pain  ? upper back pain  ? ? ?HPI patient is a pleasant 31 year old gentleman who comes in today with concerns about his upper back.  He was playing golf yesterday when he hit a shot causing his upper back to lock up.  He has had pain and stiffness throughout the parascapular region on both sides rating up into the neck.  Any movement of the neck seems to worsen his symptoms.  He notes that his arms have felt heavy.  He also notes slight burning to the left antecubital space but this is been ongoing for the past 3 weeks.   He denies any weakness to either upper extremity.  He has been taking Aleve and using ice without relief.   ? ?Review of Systems as detailed in HPI.  All others reviewed and are negative. ? ? ?Objective: ?Vital Signs: There were no vitals taken for this visit. ? ?Physical Exam well-developed well-nourished gentleman in no acute distress.  Alert and oriented x3. ? ?Ortho Exam cervical spine exam shows mild tenderness to the upper cervical spine.  He has diffuse tenderness throughout the entire cervical spine and into the paraspinous musculature.  He has very little range of motion of the neck.  No focal weakness.  He is neurovascularly intact distally. ? ?Specialty Comments:  ?No specialty comments available. ? ?Imaging: ?No results found. ? ? ?PMFS History: ?There are no problems to display for this patient. ? ?History reviewed. No pertinent past medical history.  ?History reviewed. No pertinent family history.  ?History reviewed. No pertinent surgical history. ?Social History  ? ?Occupational History  ? Not on file  ?Tobacco Use  ? Smoking status: Never  ? Smokeless tobacco: Not on file  ?Substance and Sexual  Activity  ? Alcohol use: Yes  ? Drug use: No  ? Sexual activity: Not on file  ? ? ? ? ? ? ?

## 2021-12-15 ENCOUNTER — Other Ambulatory Visit: Payer: Self-pay | Admitting: Physician Assistant

## 2021-12-15 ENCOUNTER — Encounter: Payer: Self-pay | Admitting: Orthopaedic Surgery

## 2021-12-15 ENCOUNTER — Telehealth: Payer: Self-pay | Admitting: Orthopaedic Surgery

## 2021-12-15 ENCOUNTER — Other Ambulatory Visit: Payer: Self-pay

## 2021-12-15 DIAGNOSIS — M542 Cervicalgia: Secondary | ICD-10-CM

## 2021-12-15 DIAGNOSIS — M549 Dorsalgia, unspecified: Secondary | ICD-10-CM

## 2021-12-15 MED ORDER — PREDNISONE 5 MG (21) PO TBPK: ORAL_TABLET | ORAL | 0 refills | Status: DC

## 2021-12-15 NOTE — Telephone Encounter (Signed)
I spoke with patient. He had one more day from previous steroid pack to take tomorrow. I advised to start with 6 tablets again tomorrow and hold last one for last day of this new pack. He is taking the muscle relaxer at night, but is going to try and not take it tonight to see if it is making any difference. He is not in the severe pain that he was in when he saw you, but still has tightness and the shooting pains down the arm. He would like to know if you could call him with results of MRI's if there is not a finding. He is more than happy to come in if there is a need for review, but would like to not have the copay if everything checks out normal. ? ?MRI orders entered. ? ?Sabrina--I have entered for MRIs at [facility]. Patient's wife is expecting baby any day. He would like to have MRI wherever may be able to get him in first.Thanks. ?

## 2021-12-15 NOTE — Telephone Encounter (Signed)
Pt called requesting a call back from [facility] for plan of action. Pt states he did get medication but would like a call back. Please call pt at [phone]. ?

## 2021-12-15 NOTE — Telephone Encounter (Signed)
Let me send in another steroid pack and lets get a cervical and thoracic spine mri

## 2021-12-16 NOTE — Telephone Encounter (Signed)
Yes, but I would not extend past that

## 2021-12-16 NOTE — Telephone Encounter (Signed)
All sounds good.

## 2021-12-20 ENCOUNTER — Ambulatory Visit
Admission: RE | Admit: 2021-12-20 | Discharge: 2021-12-20 | Disposition: A | Discharge status: Home / Self Care | Payer: 59 | Source: Ambulatory Visit | Attending: Physician Assistant | Admitting: Physician Assistant

## 2021-12-20 DIAGNOSIS — M549 Dorsalgia, unspecified: Secondary | ICD-10-CM

## 2021-12-20 DIAGNOSIS — M542 Cervicalgia: Secondary | ICD-10-CM

## 2021-12-20 IMAGING — MR MR CERVICAL SPINE W/O CM
5 series · 35 of 48 positions shown · non-contrast
Comparison: None.

CLINICAL DATA: Upper back pain. Neck pain symptoms for 11 days.
Injury after playing golf.

EXAM:
MRI CERVICAL SPINE WITHOUT CONTRAST
TECHNIQUE: Multiplanar, multisequence MR imaging of the cervical spine was
performed. No intravenous contrast was administered.

[Series 2: T2 · sagittal · 3.0mm · 0.41mm/px · 8 of 15 slices shown (1 of 2)]
[im 1/15]
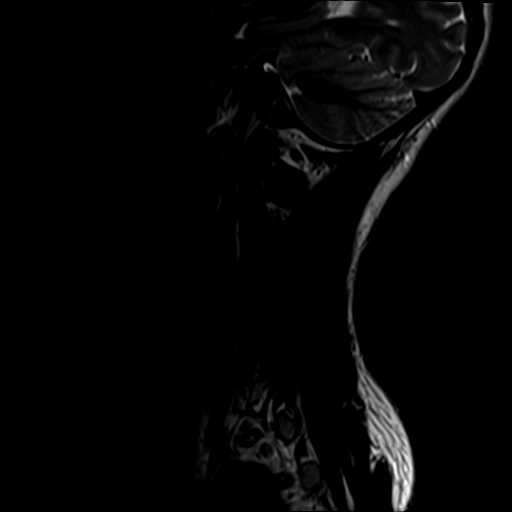
[im 3/15]
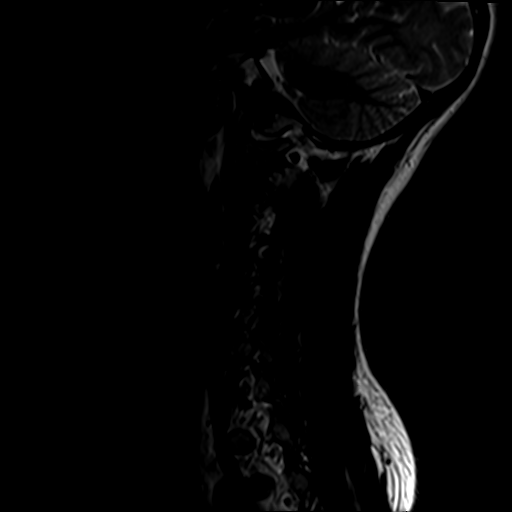
[im 5/15]
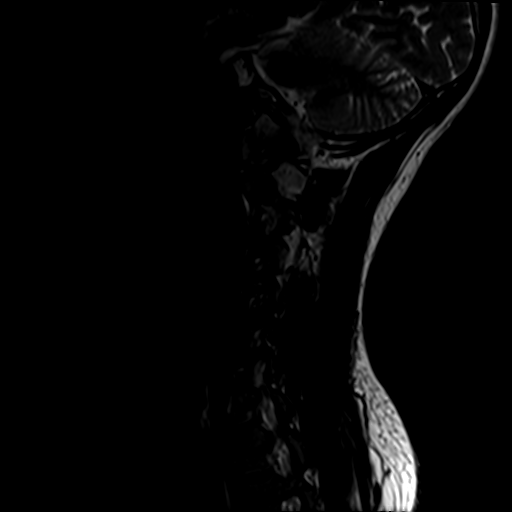
[im 7/15]
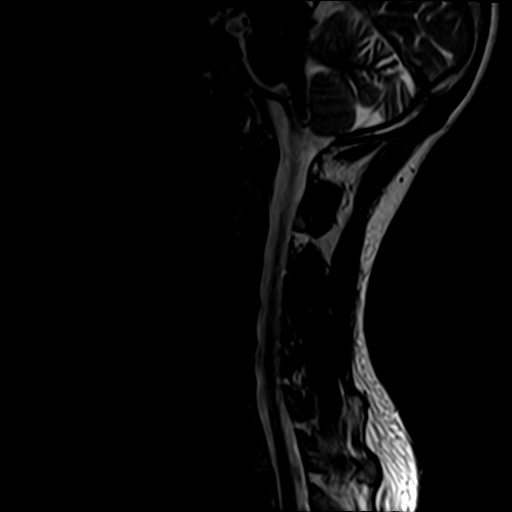
[im 9/15]
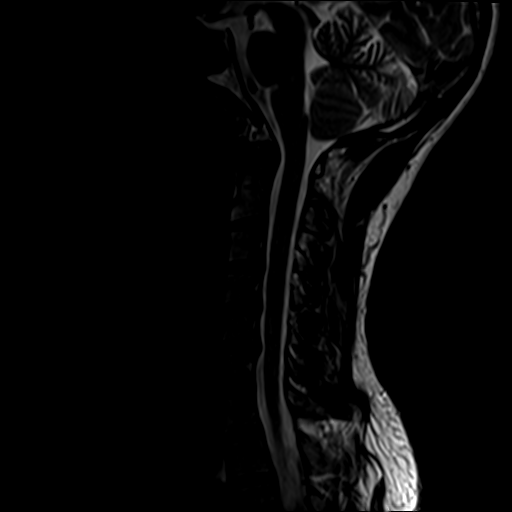
[im 11/15]
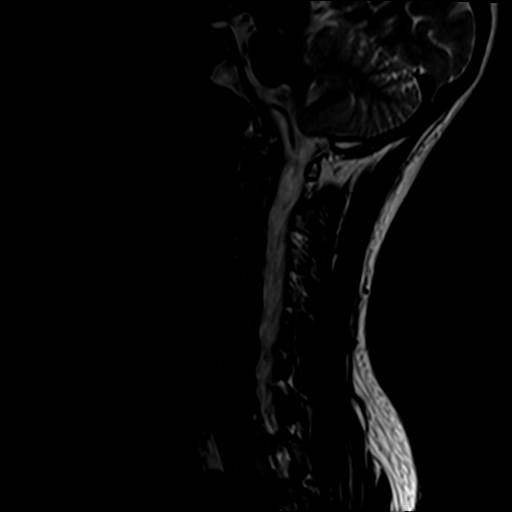
[im 13/15]
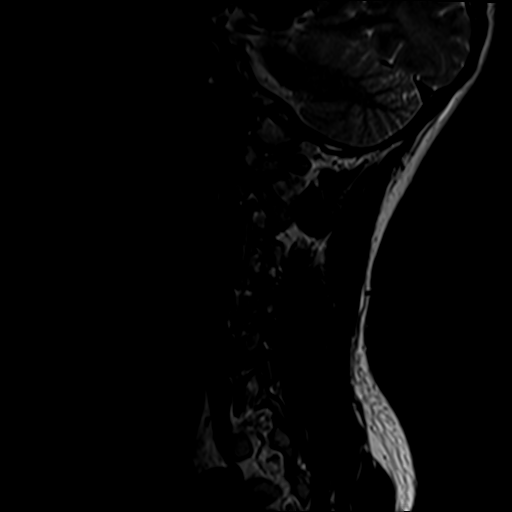
[im 15/15]
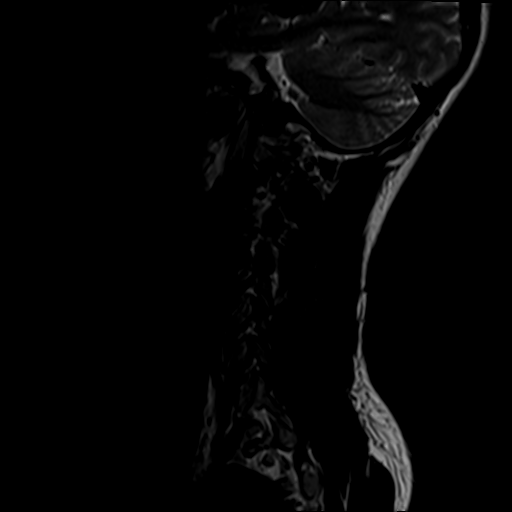

[Series 3: STIR · sagittal · 3.0mm · 0.82mm/px · 7 of 15 slices shown]
[im 1/15]
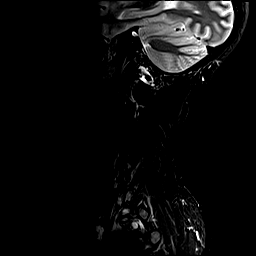
[im 3/15]
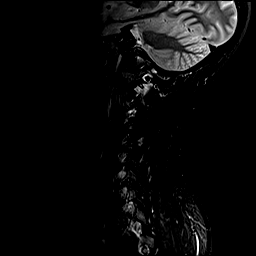
[im 5/15]
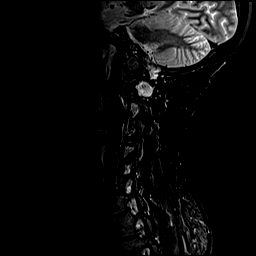
[im 8/15]
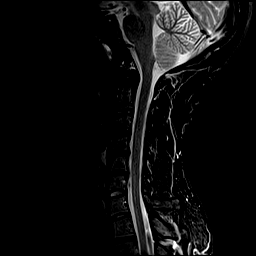
[im 10/15]
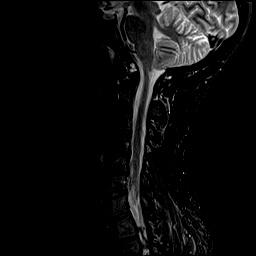
[im 12/15]
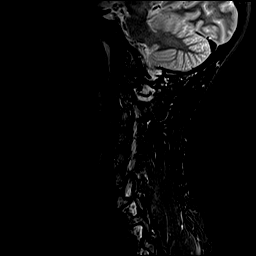
[im 15/15]
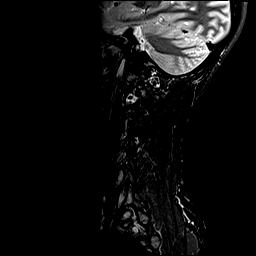

[Series 4: T1 · sagittal · 3.0mm · 0.82mm/px · 7 of 15 slices shown]
[im 1/15]
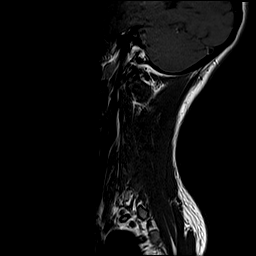
[im 3/15]
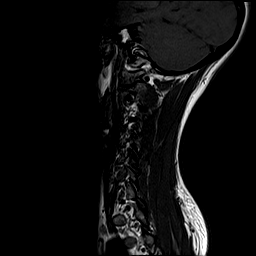
[im 5/15]
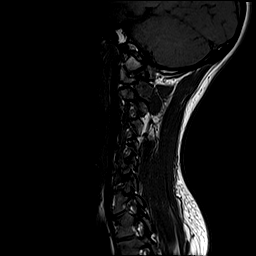
[im 8/15]
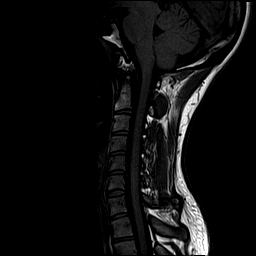
[im 10/15]
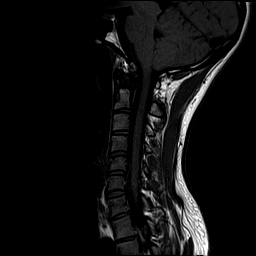
[im 12/15]
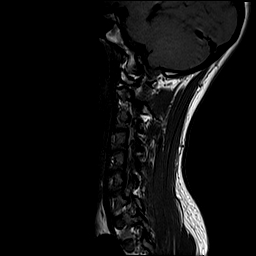
[im 15/15]
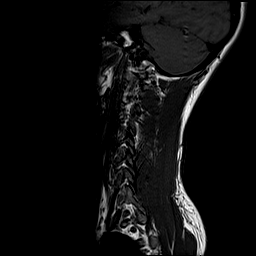

[Series 5: T2 · axial · 3.0mm · 0.70mm/px · z∈[-95,+6]mm · 9 of 28 slices shown (2 of 2)]
[im 1/28]
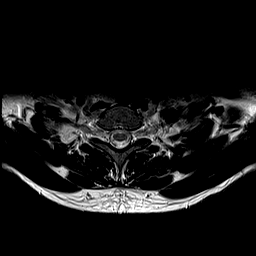
[im 5/28]
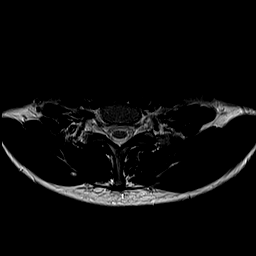
[im 10/28]
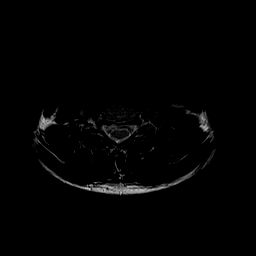
[im 12/28]
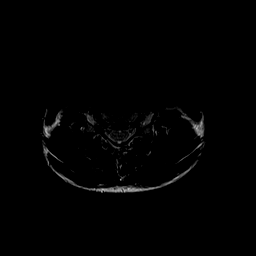
[im 14/28]
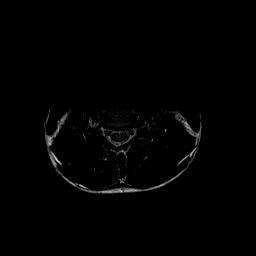
[im 16/28]
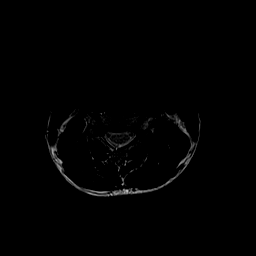
[im 19/28]
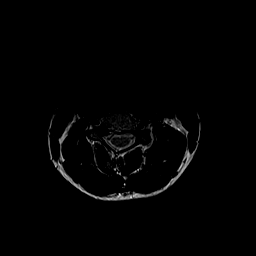
[im 23/28]
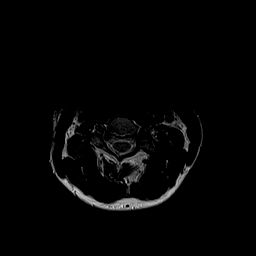
[im 28/28]
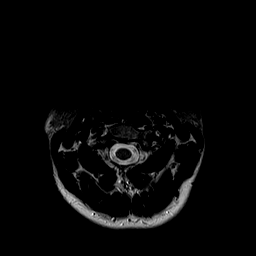

[Series 6: GRE · axial · 3.0mm · 0.35mm/px · z∈[-95,-54]mm · 4 of 28 slices shown]
[im 1/28]
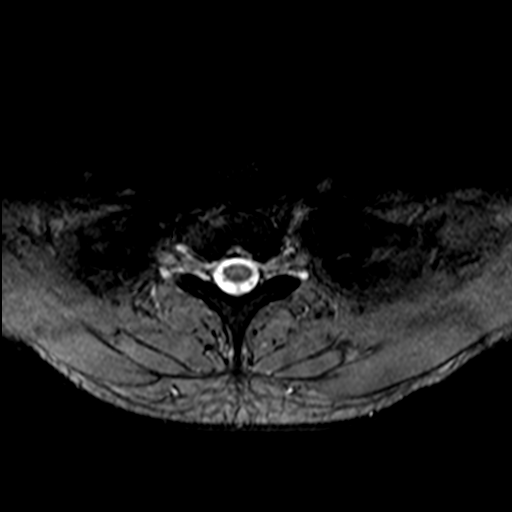
[im 5/28]
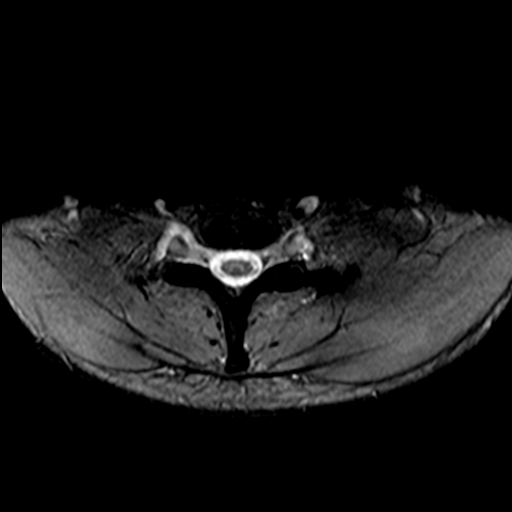
[im 10/28]
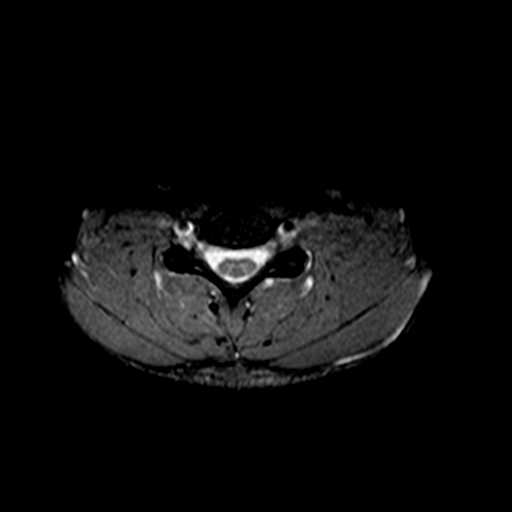
[im 12/28]
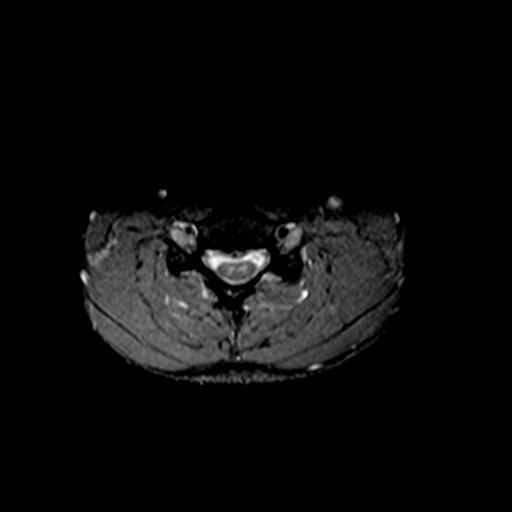

[35 of 48 positions shown; findings below may reference images not displayed]

FINDINGS: Alignment: No significant listhesis is present. Straightening of the
normal cervical lordosis.

Vertebrae: Marrow signal and vertebral body heights are normal.

Cord: Normal signal and morphology.

Posterior Fossa, vertebral arteries, paraspinal tissues:
Craniocervical junction is normal. Flow is present in the vertebral
arteries bilaterally. Visualized intracranial contents are normal.

Disc levels:

C2-3: Negative.

C3-4: Negative.

C4-5: Asymmetric right-sided uncovertebral spurring is present
without significant stenosis.

C5-6: Right paramedian soft disc protrusion partially effaces the
ventral CSF. Mild right foraminal narrowing is present.

C6-7: Negative.

C7-T1: Negative.
IMPRESSION: 1. Right paramedian soft disc protrusion at C5-6 with mild right
foraminal narrowing.
2. Asymmetric right-sided uncovertebral spurring at C4-5 without
significant stenosis.

## 2021-12-20 IMAGING — MR MR THORACIC SPINE W/O CM
4 of 6 series · 21 of 48 positions shown · non-contrast
Comparison: Prior radiograph from [DATE].

CLINICAL DATA: Initial evaluation for upper back pain and neck pain
for 11 day since playing golf.

EXAM:
MRI THORACIC SPINE WITHOUT CONTRAST
TECHNIQUE: Multiplanar, multisequence MR imaging of the thoracic spine was
performed. No intravenous contrast was administered.

[Series 3: T1 · sagittal · 3.0mm · 1.06mm/px · 3 of 7 slices shown]
[im 1/7]
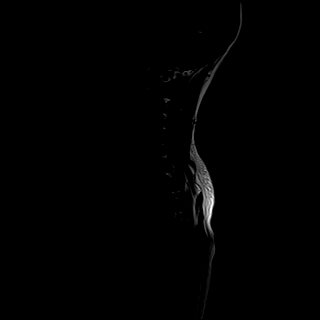
[im 5/7]
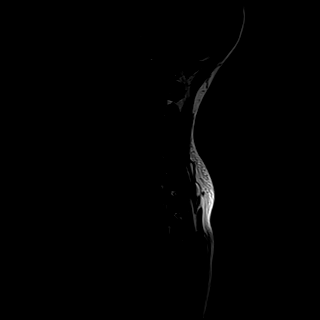
[im 7/7]
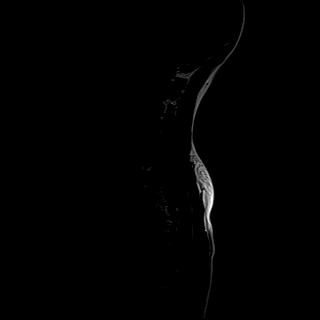

[Series 4: T2 · sagittal · 4.0mm · 0.52mm/px · 5 of 15 slices shown (1 of 3)]
[im 1/15]
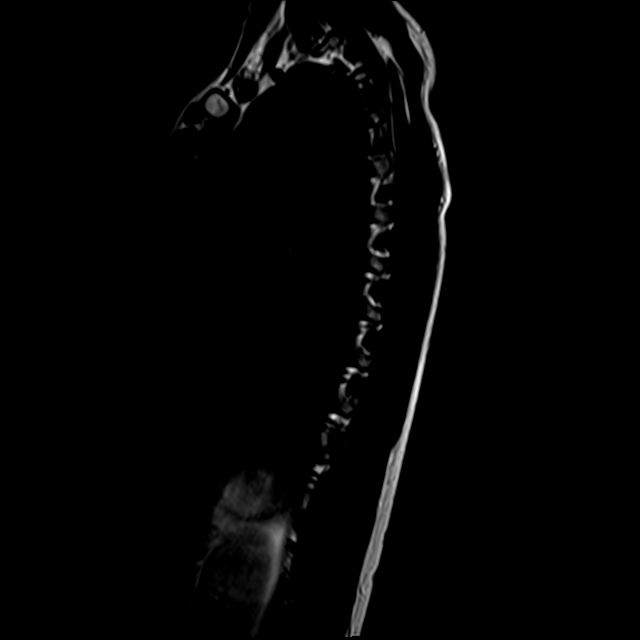
[im 4/15]
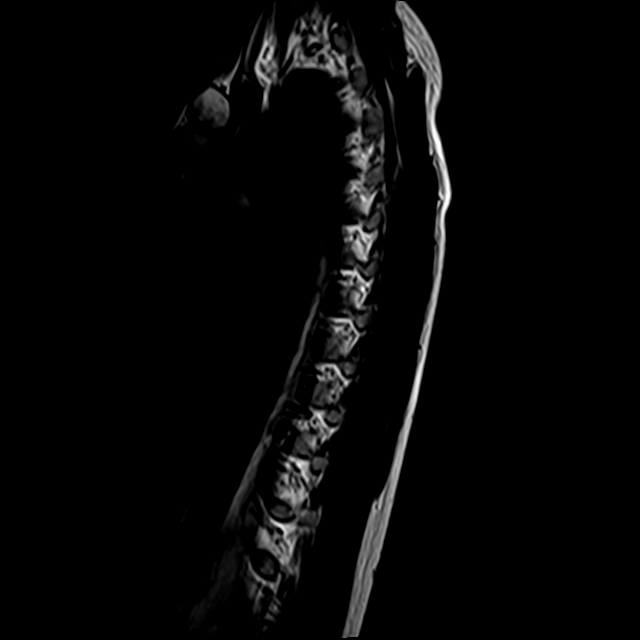
[im 8/15]
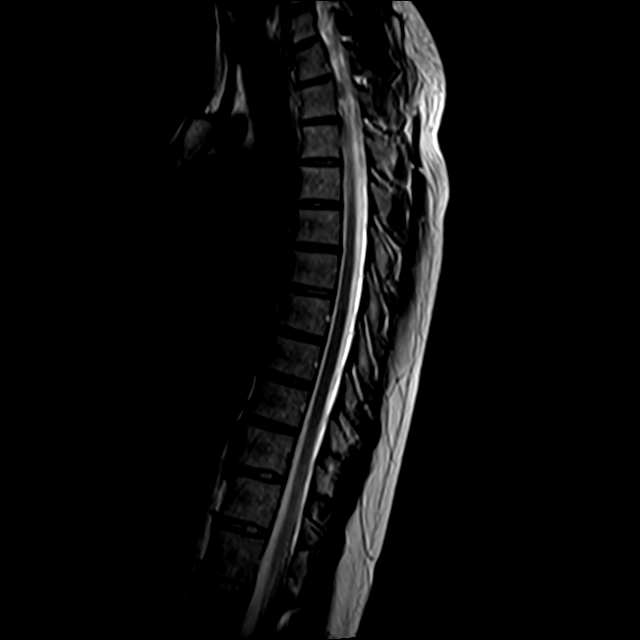
[im 11/15]
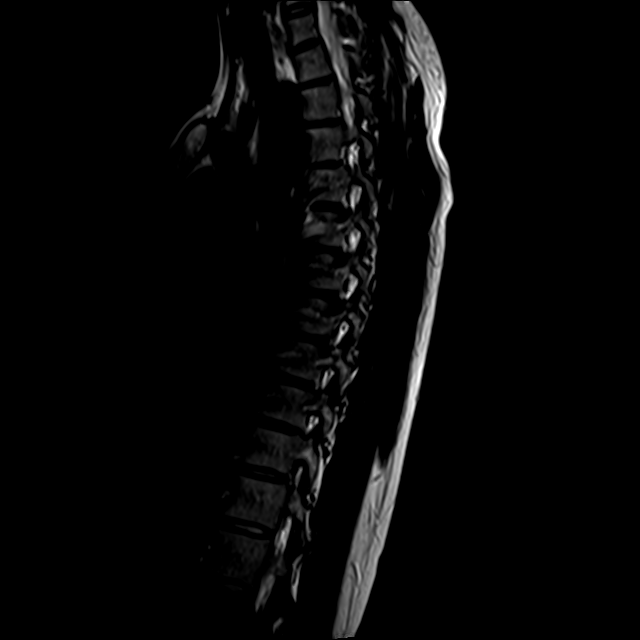
[im 15/15]
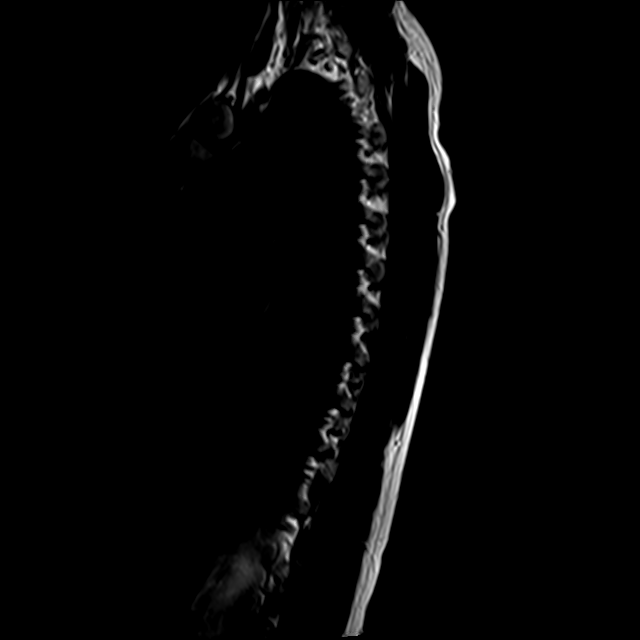

[Series 7: T2 · axial · 4.0mm · 0.39mm/px · z∈[-338,-81]mm · 8 of 39 slices shown (2 of 3)]
[im 1/39]
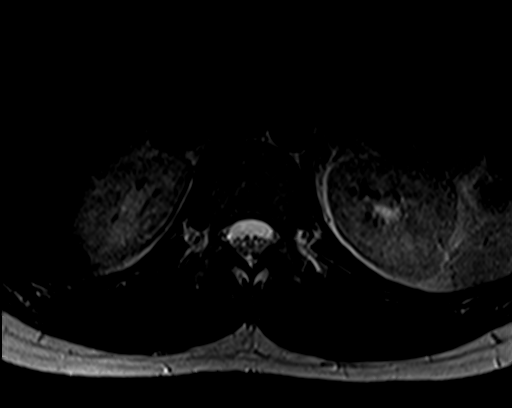
[im 6/39]
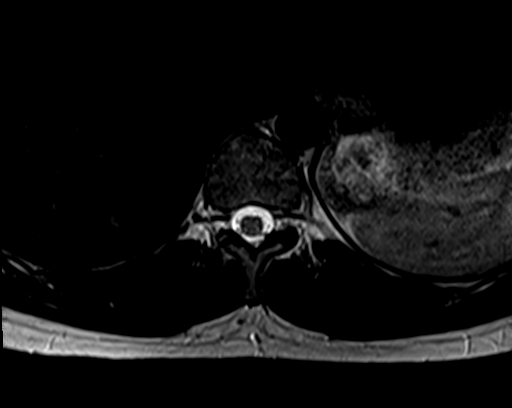
[im 12/39]
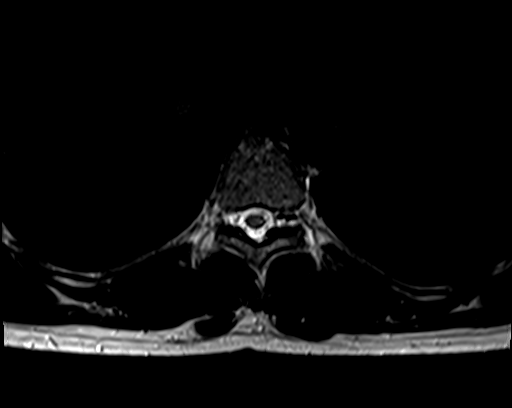
[im 18/39]
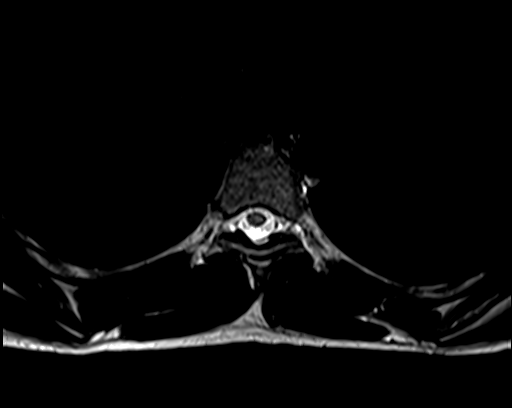
[im 21/39]
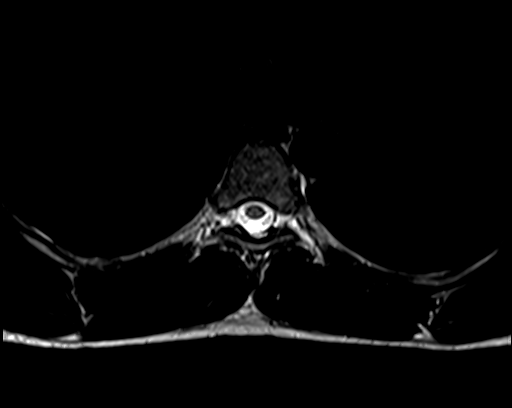
[im 27/39]
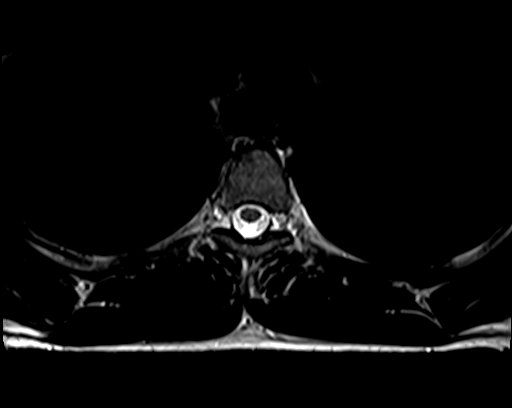
[im 33/39]
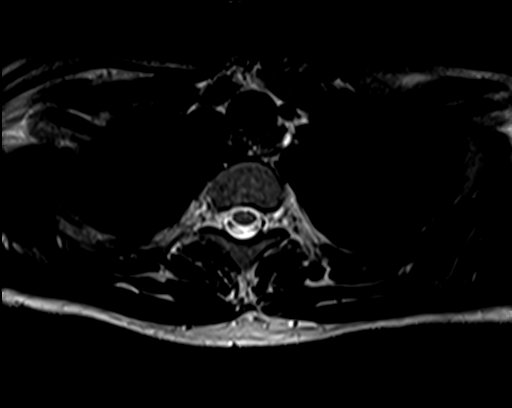
[im 39/39]
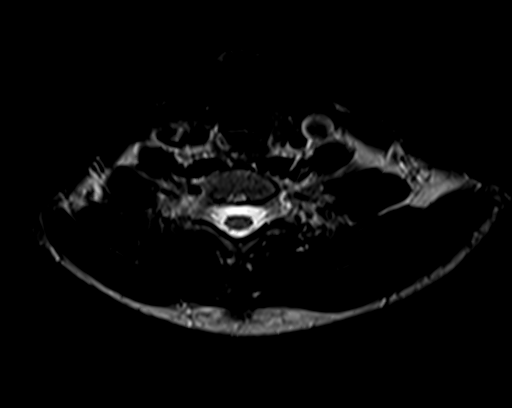

[Series 8: T2 · axial · 4.0mm · 0.39mm/px · z∈[-341,-112]mm · 5 of 40 slices shown (3 of 3)]
[im 1/40]
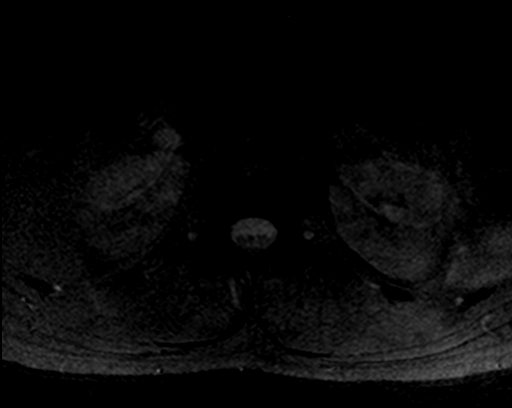
[im 6/40]
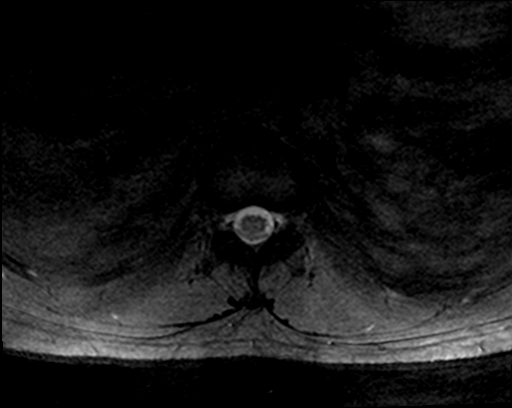
[im 12/40]
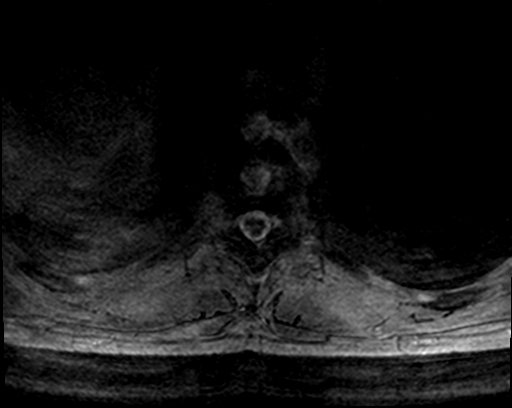
[im 20/40]
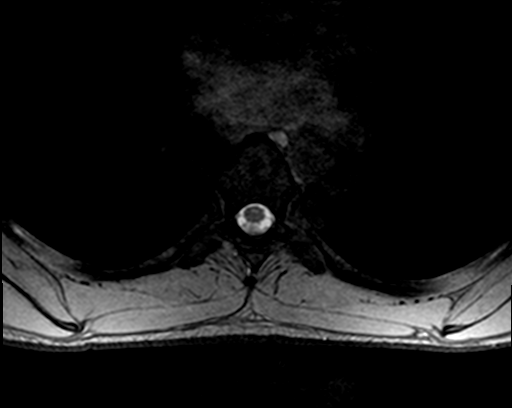
[im 34/40]
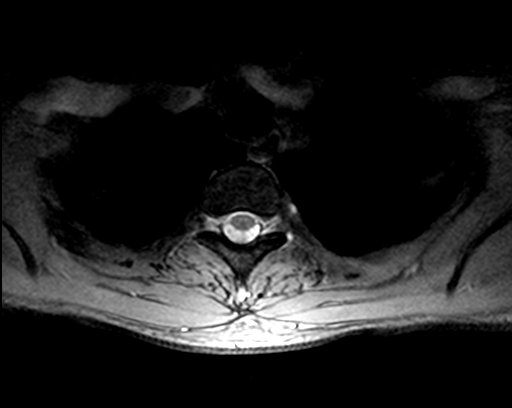

[21 of 48 positions shown; findings below may reference images not displayed]

FINDINGS: Alignment: Mild dextroscoliosis. Alignment otherwise normal with
preservation of the normal thoracic kyphosis. No listhesis.

Vertebrae: Vertebral body height well maintained without acute or
chronic fracture. Bone marrow signal intensity within normal limits.
No discrete or worrisome osseous lesions or abnormal marrow edema.

Cord:  Normal signal and morphology.

Paraspinal and other soft tissues: Unremarkable.

Disc levels:

T5-6: Single small right paracentral disc protrusion minimally
indents the right ventral thecal sac (series 8, image 17). Minimal
flattening of the ventral cord without cord signal changes or
significant spinal stenosis. Foramina remain patent.

Otherwise, no other significant disc pathology seen elsewhere within
the thoracic spine. Intervertebral disc space height maintained with
no other significant disc bulge or focal disc herniation. No
significant facet pathology. No spinal stenosis. Neural foramina
remain widely patent.
IMPRESSION: 1. Small soft right paracentral disc protrusion at T5-6 without
significant stenosis.
2. Underlying mild dextroscoliosis.
3. Otherwise unremarkable and normal MRI of the thoracic spine.

## 2021-12-22 NOTE — Progress Notes (Signed)
Can you let patient know that he has two small disc bulges, one in cervical spine and one in thoracic spine and that if his pain is still pretty bad we can make a referral to Dr. Alvester Morin for ESI?

## 2021-12-24 ENCOUNTER — Other Ambulatory Visit: Payer: Self-pay | Admitting: Physician Assistant

## 2021-12-24 DIAGNOSIS — M5412 Radiculopathy, cervical region: Secondary | ICD-10-CM

## 2021-12-24 NOTE — Progress Notes (Signed)
I spoke to patient.  Thank you so much for calling on Monday as I was so busy in the OR and would not have time to discuss.  I wanted him to know about his results and to see if we needed to make a referral to [facility] as his wife is due with a baby any moment

## 2021-12-24 NOTE — Telephone Encounter (Signed)
I spoke to him already

## 2021-12-30 NOTE — Therapy (Addendum)
?OUTPATIENT PHYSICAL THERAPY EVALUATION /DISCHARGE ? ? ?Patient Name: Justin Ochoa ?MRN: [id number] ?DOB:11-23-1990, 31 y.o., male ?Today's Date: 12/31/2021 ? ? PT End of Session - 12/31/21 1300   ? ? Visit Number 1   ? Number of Visits 3   ? Date for PT Re-Evaluation 02/11/22   ? Authorization Type UHC 20 visits   ? PT Start Time 1300   ? PT Stop Time 1325   ? PT Time Calculation (min) 25 min   ? Activity Tolerance Patient tolerated treatment well   ? Behavior During Therapy [facility] for tasks assessed/performed   ? ?  ?  ? ?  ? ? ?History reviewed. No pertinent past medical history. ?History reviewed. No pertinent surgical history. ?There are no problems to display for this patient. ? ? ?PCP: Patient, No Pcp Per (Inactive) ? ?REFERRING PROVIDER: Aundra Dubin, PA-C ? ?REFERRING DIAG: M54.12 (ICD-10-CM) - Radiculopathy of cervical spine ? ?THERAPY DIAG:  ?Cervicalgia ? ?Pain in thoracic spine ? ?ONSET DATE: December 09, 2021 ? ?SUBJECTIVE:                                                                                                                                                                                                        ? ?SUBJECTIVE STATEMENT: ?Pt indicated he was playing golf April 4th and had several swings that created neck/back pain with difficulty worsened within 24 hours.  Saw MD office and was given steroids that were helpful.  MRI was performed as well showing disc protrusion.  Pt indicated marked improvement in symptoms since onset.  Came to clinic today to investigate additional ways to continue to improvement.  Reported strain/tightness around shoulders and shoulder blades, worse in morning typically.  ? ?PERTINENT HISTORY:  ?Pt indicated self reported neck/upper back complaints at times over time.  ? ?PAIN:  ?Are you having pain? Yes: NPRS scale: at worst 2/10 in last week or so ?Pain location: neck/upper back shoulder blades ?Pain description: tightness currently.  ?Aggravating factors: morning  complaints, full range of neck ?Relieving factors: steroid pack ? ?PRECAUTIONS: None ? ?WEIGHT BEARING RESTRICTIONS No ? ?FALLS:  ?Has patient fallen in last 6 months? No ? ?LIVING ENVIRONMENT: ?Lives with: lives with their family ?Lives in: House/apartment ? 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  ? ?PLOF: Independent, golf ? ?PATIENT GOALS Reduce pain, get back to golf ? ?OBJECTIVE:  ? ?DIAGNOSTIC FINDINGS:  ?12/31/2021 ?Cervical MRI report 12/20/2021: 1. Right paramedian soft disc protrusion at C5-6 with mild right ?  foraminal narrowing. ?2. Asymmetric right-sided uncovertebral spurring at C4-5 without ?significant stenosis. ? ?COGNITION: ?12/31/2021 Overall cognitive status: Within functional limits for tasks assessed ? ?SENSATION: ?12/31/2021 WFL ? ?POSTURE:  ?12/31/2021:  mild reduction in cervical curvature ? ?PALPATION: ?12/31/2021 : tenderness and myofascial tightness in bilateral upper trap, middle trap, infraspinatus, rhomboids, levator scap  ? ?CERVICAL ROM:  ? ?Active ROM AROM (deg) ?12/31/2021  ?Flexion 69  ?Extension 51  ?Right lateral flexion 70  ?Left lateral flexion 70  ?Right rotation   ?Left rotation   ? (Blank rows = not tested) ? ?UE ROM: ?12/31/2021: unremarkable ? ?Thoracic ROM: ?12/31/2021:   extension: 75 % WFL ?  Flexion : to lap ?  Lt and Rt rotation: WFL s restriction ? ?UE MMT: ? ?MMT Right ?12/31/2021 Left ?12/31/2021  ?Shoulder flexion 5/5 5/5  ?Shoulder extension    ?Shoulder abduction 5/5 5/5  ?Shoulder adduction    ?Shoulder extension    ?Shoulder internal rotation 5/5 5/5  ?Shoulder external rotation 5/5 5/5  ?Middle trapezius    ?Lower trapezius    ?Elbow flexion 5/5 5/5  ?Elbow extension 5/5 5/5  ?Wrist flexion    ?Wrist extension    ?Wrist ulnar deviation    ?Wrist radial deviation    ?Wrist pronation    ?Wrist supination    ?Grip strength    ? (Blank rows = not tested) ? ? ?TODAY'S TREATMENT:  ?12/31/2021 ? Therex: ?  HEP  instruction/performance c cues for techniques, handout provided.  Trial set performed of each for comprehension and symptom assessment.  See below for exercise list. ? Manual: ?  Prone regional PA g5 manip x 2 to thoracic middle ? ? ?PATIENT EDUCATION:  ?12/31/2021 ?Education details: HEP, POC ?Person educated: Patient ?Education method: Explanation, Demonstration, Verbal cues, and Handouts ?Education comprehension: verbalized understanding, returned demonstration, and verbal cues required ? ? ?HOME EXERCISE PROGRAM: ?12/31/2021 ?Access Code: [email or web address] ?URL: [email or web address] ?Date: 12/31/2021 ?Prepared by: Scot Jun ? ?Exercises ?- Prone Scapular Retraction  - 2 x daily - 7 x weekly - 1 sets - 10 reps - 5 hold ?- Prone Scapular Slide with Shoulder Extension  - 2 x daily - 7 x weekly - 1 sets - 10 reps - 5 hold ?- Prone Alternating Arm and Leg Lifts  - 2 x daily - 7 x weekly - 1 sets - 10 reps - 2-3 hold ?- Standing Shoulder External Rotation with Resistance  - 1-2 x daily - 7 x weekly - 1 sets - 15 reps ?- Isometric Cervical Extension at Wall with Ball  - 1-2 x daily - 7 x weekly - 1 sets - 10 reps - 5 hold ?- Standing thoracic rotation c horizontal abduction c band 1 x 15 bilateral 2 x day ? ?ASSESSMENT: ? ?CLINICAL IMPRESSION: ?Patient is a 31 y.o. who comes to clinic with complaints of cervicothoracic pain with mobilitydeficits that impair their ability to perform usual daily and recreational functional activities without increase difficulty/symptoms at this time.  Patient to benefit from skilled PT services to address impairments and limitations to improve to previous level of function without restriction secondary to condition.  ? ? ?OBJECTIVE IMPAIRMENTS decreased activity tolerance, decreased ROM, increased fascial restrictions, impaired perceived functional ability, increased muscle spasms, impaired flexibility, improper body mechanics, and pain.  ? ?ACTIVITY LIMITATIONS driving and  work, exercise .  ? ? ?REHAB POTENTIAL: Good ? ?CLINICAL DECISION MAKING: Stable/uncomplicated ? ?EVALUATION COMPLEXITY: Low ? ? ?GOALS: ?Goals reviewed with patient? Yes ? ?Short  term PT Goals (target date for Short term goals are 3 weeks 01/21/2022) ?Patient will demonstrate independent use of home exercise program to maintain progress from in clinic treatments. ?Goal status: New ?  ?Long term PT goals (target dates for all long term goals are 6 weeks  02/11/2022 ) ? ?1. Patient will demonstrate/report pain at worst less than or equal to 2/10 to facilitate minimal limitation in daily activity secondary to pain symptoms. ?Goal status: New ? ?2. Patient will demonstrate independent use of home exercise program to facilitate ability to maintain/progress functional gains from skilled physical therapy services. ?Goal status: New ? ?3. Patient will demonstrate/report ability to perform golf at [facility] [employer].  ?Goal status: New ? ?4.  Patient will demonstrate cervical AROM rotation improvement 10 degress s symptoms to facilitate daily activity including driving, self care at PLOF s limitation due to symptoms. ?Goal status: New ? ? ?PLAN: ?PT FREQUENCY: 1 visit every 2 weeks ? ?PT DURATION: 6 weeks ? ?PLANNED INTERVENTIONS: Therapeutic exercises, Therapeutic activity, Neuro Muscular re-education, Balance training, Gait training, Patient/Family education, Joint mobilization, Stair training, DME instructions, Dry Needling, Electrical stimulation, Cryotherapy, Moist heat, Taping, Ultrasound, Ionotophoresis 47m/ml Dexamethasone, and Manual therapy.  All included unless contraindicated ? ?PLAN FOR NEXT SESSION: Review HEP, possible myofascial release techniques.  ? ?MScot Jun PT, DPT, OCS, ATC ?12/31/21  1:31 PM ? ?PHYSICAL THERAPY DISCHARGE SUMMARY ? ?Visits from Start of Care: 1 ? ?Current functional level related to goals / functional outcomes: ?See note ?  ?Remaining deficits: ?See note ?  ?Education / Equipment: ?HEP   ? ?Patient agrees to discharge. Patient goals were partially met. Patient is being discharged due to not returning since the last visit. ?MScot Jun PT, DPT, OCS, ATC ?01/14/22  8:56 AM ? ? ? ? ? ? ? ? ? ? ?

## 2021-12-31 ENCOUNTER — Other Ambulatory Visit: Payer: Self-pay

## 2021-12-31 ENCOUNTER — Encounter: Payer: Self-pay | Admitting: Rehabilitative and Restorative Service Providers"

## 2021-12-31 ENCOUNTER — Ambulatory Visit (INDEPENDENT_AMBULATORY_CARE_PROVIDER_SITE_OTHER): Payer: 59 | Admitting: Rehabilitative and Restorative Service Providers"

## 2021-12-31 DIAGNOSIS — M546 Pain in thoracic spine: Secondary | ICD-10-CM | POA: Diagnosis not present

## 2021-12-31 DIAGNOSIS — M542 Cervicalgia: Secondary | ICD-10-CM

## 2022-01-15 ENCOUNTER — Encounter: Payer: 59 | Admitting: Rehabilitative and Restorative Service Providers"

## 2022-07-01 ENCOUNTER — Ambulatory Visit: Payer: Self-pay | Admitting: Urology

## 2022-07-28 ENCOUNTER — Encounter: Payer: Self-pay | Admitting: Orthopaedic Surgery

## 2022-07-28 ENCOUNTER — Ambulatory Visit (INDEPENDENT_AMBULATORY_CARE_PROVIDER_SITE_OTHER): Payer: 59 | Admitting: Orthopaedic Surgery

## 2022-07-28 DIAGNOSIS — M549 Dorsalgia, unspecified: Secondary | ICD-10-CM

## 2022-07-28 NOTE — Progress Notes (Signed)
   Office Visit Note   Patient: Justin Ochoa           Date of Birth: 1991-07-10           MRN: [id number] Visit Date: 07/28/2022              Requested by: No referring provider defined for this encounter. PCP: Patient, No Pcp Per   Assessment & Plan: Visit Diagnoses:  1. Upper back pain     Plan: Impression is right scapular trigger points.  I demonstrated some home exercises and techniques to him.  We will also make referral to outpatient PT.  I reviewed the C and T-spine MRIs which are not consistent with his symptoms.  Follow-Up Instructions: No follow-ups on file.   Orders:  Orders Placed This Encounter  Procedures   Ambulatory referral to Physical Therapy   No orders of the defined types were placed in this encounter.     Procedures: No procedures performed   Clinical Data: No additional findings.   Subjective: Chief Complaint  Patient presents with   Neck - Pain    HPI Patient returns today for right posterior neck and shoulder pain.  Has occasional weird sensation down into the small finger in the right hand.  Denies any numbness and tingling or pain. Review of Systems   Objective: Vital Signs: There were no vitals taken for this visit.  Physical Exam  Ortho Exam Examination of the right shoulder girdle shows tenderness to the rhomboids.  Negative Spurling's.  Manual muscle testing of the shoulder is normal and without pain. Specialty Comments:  No specialty comments available.  Imaging: No results found.   PMFS History: There are no problems to display for this patient.  History reviewed. No pertinent past medical history.  No family history on file.  History reviewed. No pertinent surgical history. Social History   Occupational History   Not on file  Tobacco Use   Smoking status: Never   Smokeless tobacco: Not on file  Substance and Sexual Activity   Alcohol use: Yes   Drug use: No   Sexual activity: Not on file

## 2022-08-03 ENCOUNTER — Other Ambulatory Visit: Payer: Self-pay | Admitting: Orthopaedic Surgery

## 2022-08-03 ENCOUNTER — Encounter: Payer: Self-pay | Admitting: Orthopaedic Surgery

## 2022-08-03 DIAGNOSIS — M549 Dorsalgia, unspecified: Secondary | ICD-10-CM

## 2022-08-03 MED ORDER — PREDNISONE 10 MG (21) PO TBPK: ORAL_TABLET | ORAL | 3 refills | Status: DC

## 2022-08-06 ENCOUNTER — Other Ambulatory Visit: Payer: Self-pay

## 2022-08-06 ENCOUNTER — Encounter: Payer: Self-pay | Admitting: Physical Therapy

## 2022-08-06 ENCOUNTER — Ambulatory Visit (INDEPENDENT_AMBULATORY_CARE_PROVIDER_SITE_OTHER): Payer: 59 | Admitting: Physical Therapy

## 2022-08-06 DIAGNOSIS — M25511 Pain in right shoulder: Secondary | ICD-10-CM | POA: Diagnosis not present

## 2022-08-06 DIAGNOSIS — M546 Pain in thoracic spine: Secondary | ICD-10-CM

## 2022-08-06 DIAGNOSIS — G8929 Other chronic pain: Secondary | ICD-10-CM | POA: Diagnosis not present

## 2022-08-06 DIAGNOSIS — M542 Cervicalgia: Secondary | ICD-10-CM | POA: Diagnosis not present

## 2022-08-06 NOTE — Therapy (Signed)
OUTPATIENT PHYSICAL THERAPY Thoracic/ SHOULDER EVALUATION   Patient Name: Justin Ochoa MRN: [id number] DOB:01-03-1991, 31 y.o., male Today's Date: 08/06/2022  END OF SESSION:  PT End of Session - 08/06/22 1207     Visit Number 1    Number of Visits 4    Date for PT Re-Evaluation 09/17/22    Authorization Type UHC    Authorization - Visit Number 1    Authorization - Number of Visits 19    PT Start Time 1105    PT Stop Time 1155    PT Time Calculation (min) 50 min    Activity Tolerance Patient tolerated treatment well    Behavior During Therapy WFL for tasks assessed/performed             History reviewed. No pertinent past medical history. History reviewed. No pertinent surgical history. There are no problems to display for this patient.   PCP: no PCP  REFERRING PROVIDER: Tarry Kos, MD   REFERRING DIAG: M54.9 (ICD-10-CM) - Upper back pain  THERAPY DIAG:  Pain in thoracic spine  Chronic right shoulder pain  Cervicalgia  Rationale for Evaluation and Treatment: Rehabilitation  ONSET DATE:   SUBJECTIVE:                                                                                                                                                                                      SUBJECTIVE STATEMENT: He relays chronic pain since he can remember in Rt thoracic and scapula with trigger points. He saw MD in April about this and had MRI's. He does try various stretching, foam rolling, and trigger point release techniques at home. He does get some burning/tingling down is Rt arm at time  PERTINENT HISTORY: Chronic pain  PAIN:  Are you having pain? Yes: NPRS scale: 4/10 Pain location: Rt upper trap, scapular region, neck Pain description: burning, tightness Aggravating factors: turing head, golf sometimes  Relieving factors: heat  PRECAUTIONS: None  WEIGHT BEARING RESTRICTIONS: No  FALLS:  Has patient fallen in last 6 months?  No  OCCUPATION:   PLOF: Independent  PATIENT GOALS:reduce pain, learn how to manage this better long term  NEXT MD VISIT:   OBJECTIVE:   DIAGNOSTIC FINDINGS:  Thoracic MRI 12/20/21 IMPRESSION: 1. Small soft right paracentral disc protrusion at T5-6 without significant stenosis. 2. Underlying mild dextroscoliosis. 3. Otherwise unremarkable and normal MRI of the thoracic spine.    Cervical MRI report 12/20/2021: 1. Right paramedian soft disc protrusion at C5-6 with mild right foraminal narrowing. 2. Asymmetric right-sided uncovertebral spurring at C4-5 without significant stenosis.  PATIENT SURVEYS:  FOTO 63% funcitonal, goal is 75%  COGNITION: Overall cognitive  status: Within functional limits for tasks assessed     SENSATION: WFL  POSTURE: WFL  UPPER EXTREMITY ROM:   Active ROM Right eval Left eval  Shoulder flexion WNL WNL  Shoulder extension    Shoulder abduction WNL WNL  Shoulder adduction    Shoulder internal rotation WNL WNL  Shoulder external rotation WNL WNl      Neck flexion 30   Neck extension 40   Neck lateral flexion 20 20      Neck rotation  60 55              (Blank rows = not tested)  UPPER EXTREMITY MMT:  MMT Right eval Left eval  Shoulder flexion 5 5  Shoulder extension    Shoulder abduction 5 5  Shoulder adduction    Shoulder internal rotation 5 5  Shoulder external rotation 5 5  Middle trapezius    Lower trapezius    Elbow flexion    Elbow extension    Wrist flexion    Wrist extension    Wrist ulnar deviation    Wrist radial deviation    Wrist pronation    Wrist supination    Grip strength (lbs)    (Blank rows = not tested)  SHOULDER SPECIAL TESTS:  JOINT MOBILITY TESTING:    PALPATION:  Tender to palpation and trigger points Rt rhomboids, infraspinatus, subscapularis, upper trap   TODAY'S TREATMENT:  Eval HEP creation and review, see below for details Manual therapy for skilled palpation and Trigger Point  Dry-Needling  Treatment instructions: Expect mild to moderate muscle soreness. Patient Consent Given: Yes Education handout provided:  provided Muscles treated:  Rt upper trap muscle belly and insertion, Rt subscapularis and rhomboid/mid trap Treatment response/outcome: fair overall tolerance, twitch response in muscle noted. He did appear to have mild sympathetic nervous response with sweating and seeing stars.     PATIENT EDUCATION: Education details: HEP, PT plan of care Person educated: Patient Education method: Explanation, Demonstration, Verbal cues, and Handouts Education comprehension: verbalized understanding and needs further education   HOME EXERCISE PROGRAM: Access Code: [email or web address] URL: [email or web address] Date: 08/06/2022 Prepared by: Ivery Quale  Exercises - Seated Thoracic Extension with Hands Behind Neck  - 2 x daily - 6 x weekly - 1-2 sets - 10 reps - 5 sec hold - Seated Assisted Cervical Rotation with Towel  - 2 x daily - 6 x weekly - 1 sets - 10 reps - 5 hold - Mid-Lower Cervical Extension SNAG with Strap  - 2 x daily - 6 x weekly - 1 sets - 10 reps - 5 sec hold - Seated Passive Cervical Retraction  - 2 x daily - 6 x weekly - 1 sets - 10 reps - 5 sec hold - Seated Cervical Sidebending Stretch  - 2 x daily - 6 x weekly - 1 sets - 2-3 reps - 30 sec hold  ASSESSMENT:  CLINICAL IMPRESSION: Patient referred to PT for Eval and treat scapular trigger points with Dry needling. Of note he does get some radicular symptoms intermittently into his Rt arm and does have history of disc protrusion cervical and thoracic on MRI. He also may be having some facet arthropathy. I did trial DN today to his trigger points followed by heat and Estim. Patient will benefit from skilled PT to address below impairments, limitations and improve overall function.  OBJECTIVE IMPAIRMENTS: decreased activity tolerance, decreased neck mobility, decreased ROM,  impaired flexibility,  impaired UE use, postural dysfunction, and pain.  ACTIVITY LIMITATIONS: reaching, lifting, carry,  cleaning, driving, golf  PERSONAL FACTORS: chronic pain also affecting patient's functional outcome.  REHAB POTENTIAL: Good  CLINICAL DECISION MAKING: Stable/uncomplicated  EVALUATION COMPLEXITY: Low    GOALS: Short term PT Goals Target date: 09/03/2022   Pt will be I and compliant with HEP. Baseline:  Goal status: New Pt will decrease pain by 25% overall Baseline: Goal status: New  Long term PT goals Target date:09/17/2022   Pt will improve neck AROM to [facility] to improve functional scanning of his enviornment Baseline: Goal status: New Pt will improve FOTO to at least 75% functional to show improved function Baseline: Goal status: New Pt will reduce pain to overall less than 50% with usual activity and work activity. Baseline: Goal status: New  PLAN: PT FREQUENCY: 1 times per week   PT DURATION:4-6 weeks  PLANNED INTERVENTIONS (unless contraindicated): aquatic PT, Canalith repositioning, cryotherapy, Electrical stimulation, Iontophoresis with 4 mg/ml dexamethasome, Moist heat, traction, Ultrasound, gait training, Therapeutic exercise, balance training, neuromuscular re-education, patient/family education, prosthetic training, manual techniques, passive ROM, dry needling, taping, vasopnuematic device, vestibular, spinal manipulations, joint manipulations  PLAN FOR NEXT SESSION: review HEP, how was DN and TENS from last time and repeat if desired although 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 Consider thoracic manipulation    April Manson, PT,DPT 08/06/2022, 12:10 PM

## 2022-08-21 ENCOUNTER — Encounter: Payer: Self-pay | Admitting: Physical Therapy

## 2022-08-21 ENCOUNTER — Ambulatory Visit (INDEPENDENT_AMBULATORY_CARE_PROVIDER_SITE_OTHER): Payer: 59 | Admitting: Physical Therapy

## 2022-08-21 DIAGNOSIS — M25511 Pain in right shoulder: Secondary | ICD-10-CM

## 2022-08-21 DIAGNOSIS — M542 Cervicalgia: Secondary | ICD-10-CM

## 2022-08-21 DIAGNOSIS — G8929 Other chronic pain: Secondary | ICD-10-CM

## 2022-08-21 DIAGNOSIS — M546 Pain in thoracic spine: Secondary | ICD-10-CM | POA: Diagnosis not present

## 2022-08-21 NOTE — Therapy (Signed)
OUTPATIENT PHYSICAL THERAPY TREATMENT  Patient Name: Justin Ochoa MRN: [id number] DOB:1991-02-13, 31 y.o., male Today's Date: 08/21/2022  END OF SESSION:  PT End of Session - 08/21/22 0843     Visit Number 2    Number of Visits 4    Date for PT Re-Evaluation 09/17/22    Authorization Type UHC    Authorization - Number of Visits 19    PT Start Time 0845    PT Stop Time 0923    PT Time Calculation (min) 38 min    Activity Tolerance Patient tolerated treatment well    Behavior During Therapy [facility] for tasks assessed/performed             History reviewed. No pertinent past medical history. History reviewed. No pertinent surgical history. There are no problems to display for this patient.   PCP: no PCP  REFERRING PROVIDER: Tarry Kos, MD   REFERRING DIAG: M54.9 (ICD-10-CM) - Upper back pain  THERAPY DIAG:  Pain in thoracic spine  Chronic right shoulder pain  Cervicalgia  Rationale for Evaluation and Treatment: Rehabilitation  ONSET DATE:   SUBJECTIVE:                                                                                                                                                                                      SUBJECTIVE STATEMENT: He relays the pain and tightness feels so much better.   PERTINENT HISTORY: Chronic pain  PAIN:  Are you having pain? Yes: NPRS scale: 1/10 Pain location: Rt upper trap, scapular region, neck Pain description: burning, tightness Aggravating factors: turing head, golf sometimes  Relieving factors: heat  PRECAUTIONS: None  WEIGHT BEARING RESTRICTIONS: No  FALLS:  Has patient fallen in last 6 months? No  OCCUPATION:   PLOF: Independent  PATIENT GOALS:reduce pain, learn how to manage this better long term  NEXT MD VISIT:   OBJECTIVE:   DIAGNOSTIC FINDINGS:  Thoracic MRI 12/20/21 IMPRESSION: 1. Small soft right paracentral disc protrusion at T5-6 without significant stenosis. 2. Underlying mild  dextroscoliosis. 3. Otherwise unremarkable and normal MRI of the thoracic spine.    Cervical MRI report 12/20/2021: 1. Right paramedian soft disc protrusion at C5-6 with mild right foraminal narrowing. 2. Asymmetric right-sided uncovertebral spurring at C4-5 without significant stenosis.  PATIENT SURVEYS:  FOTO 63% funcitonal, goal is 75%  COGNITION: Overall cognitive status: Within functional limits for tasks assessed     SENSATION: WFL  POSTURE: WFL  UPPER EXTREMITY ROM:   Active ROM Right eval Left eval Right/Left 08/21/22  Shoulder flexion WNL WNL   Shoulder extension     Shoulder abduction WNL WNL   Shoulder adduction  Shoulder internal rotation WNL WNL   Shoulder external rotation WNL WNl        Neck flexion 30  40  Neck extension 40  WNL  Neck lateral flexion 20 20 30/30       Neck rotation  60 55 65/65                 (Blank rows = not tested)  UPPER EXTREMITY MMT:  MMT Right eval Left eval  Shoulder flexion 5 5  Shoulder extension    Shoulder abduction 5 5  Shoulder adduction    Shoulder internal rotation 5 5  Shoulder external rotation 5 5  Middle trapezius    Lower trapezius    Elbow flexion    Elbow extension    Wrist flexion    Wrist extension    Wrist ulnar deviation    Wrist radial deviation    Wrist pronation    Wrist supination    Grip strength (lbs)    (Blank rows = not tested)  SHOULDER SPECIAL TESTS:  JOINT MOBILITY TESTING:    PALPATION:  Tender to palpation and trigger points Rt rhomboids, infraspinatus, subscapularis, upper trap   TODAY'S TREATMENT:  08/21/22 HEP review and performed one set of each see below for details. Manual therapy for grade 4-5 Cervical-thoracic mobilizations and mobilization with movement for Rt upper cervical into left rotation and side bend. skilled palpation and Trigger Point Dry-Needling  Treatment instructions: Expect mild to moderate muscle soreness. Patient Consent Given:  Yes Education handout provided:  provided Muscles treated:  Rt upper trap muscle belly and insertion, Rt subscapularis and rhomboid/mid trap Treatment response/outcome: fair overall tolerance, twitch response in muscle noted. He had better tolerance to this with less sympathetic response.       PATIENT EDUCATION: Education details: HEP, PT plan of care Person educated: Patient Education method: Explanation, Demonstration, Verbal cues, and Handouts Education comprehension: verbalized understanding and needs further education   HOME EXERCISE PROGRAM: Access Code: [email or web address] URL: [email or web address] Date: 08/06/2022 Prepared by: Ivery Quale  Exercises - Seated Thoracic Extension with Hands Behind Neck  - 2 x daily - 6 x weekly - 1-2 sets - 10 reps - 5 sec hold - Seated Assisted Cervical Rotation with Towel  - 2 x daily - 6 x weekly - 1 sets - 10 reps - 5 hold - Mid-Lower Cervical Extension SNAG with Strap  - 2 x daily - 6 x weekly - 1 sets - 10 reps - 5 sec hold - Seated Passive Cervical Retraction  - 2 x daily - 6 x weekly - 1 sets - 10 reps - 5 sec hold - Seated Cervical Sidebending Stretch  - 2 x daily - 6 x weekly - 1 sets - 2-3 reps - 30 sec hold  ASSESSMENT:  CLINICAL IMPRESSION: He had good relief from DN so this was continued today. Did try Estim with this but he appears to prefer it without the Estim. He had better overall tolerance to this today as well with less sympathetic nervous system response. He did show good improvement with his neck ROM today with updated measurements.   OBJECTIVE IMPAIRMENTS: decreased activity tolerance, decreased neck mobility, decreased ROM,  impaired flexibility, impaired UE use, postural dysfunction, and pain.  ACTIVITY LIMITATIONS: reaching, lifting, carry,  cleaning, driving, golf  PERSONAL FACTORS: chronic pain also affecting patient's functional outcome.  REHAB POTENTIAL: Good  CLINICAL DECISION MAKING:  Stable/uncomplicated  EVALUATION COMPLEXITY: Low    GOALS: Short term PT  Goals Target date: 09/03/2022   Pt will be I and compliant with HEP. Baseline:  Goal status: New Pt will decrease pain by 25% overall Baseline: Goal status: New  Long term PT goals Target date:09/17/2022   Pt will improve neck AROM to [facility] to improve functional scanning of his enviornment Baseline: Goal status: New Pt will improve FOTO to at least 75% functional to show improved function Baseline: Goal status: New Pt will reduce pain to overall less than 50% with usual activity and work activity. Baseline: Goal status: New  PLAN: PT FREQUENCY: 1 times per week   PT DURATION:4-6 weeks  PLANNED INTERVENTIONS (unless contraindicated): aquatic PT, Canalith repositioning, cryotherapy, Electrical stimulation, Iontophoresis with 4 mg/ml dexamethasome, Moist heat, traction, Ultrasound, gait training, Therapeutic exercise, balance training, neuromuscular re-education, patient/family education, prosthetic training, manual techniques, passive ROM, dry needling, taping, vasopnuematic device, vestibular, spinal manipulations, joint manipulations  PLAN FOR NEXT SESSION: DN if desired.    April Manson, PT,DPT 08/21/2022, 8:44 AM

## 2022-08-26 ENCOUNTER — Ambulatory Visit (INDEPENDENT_AMBULATORY_CARE_PROVIDER_SITE_OTHER): Payer: 59 | Admitting: Physical Therapy

## 2022-08-26 ENCOUNTER — Encounter: Payer: Self-pay | Admitting: Physical Therapy

## 2022-08-26 DIAGNOSIS — M546 Pain in thoracic spine: Secondary | ICD-10-CM

## 2022-08-26 DIAGNOSIS — M542 Cervicalgia: Secondary | ICD-10-CM | POA: Diagnosis not present

## 2022-08-26 DIAGNOSIS — M25511 Pain in right shoulder: Secondary | ICD-10-CM

## 2022-08-26 DIAGNOSIS — G8929 Other chronic pain: Secondary | ICD-10-CM

## 2022-08-26 NOTE — Therapy (Signed)
OUTPATIENT PHYSICAL THERAPY TREATMENT/Discharge PHYSICAL THERAPY DISCHARGE SUMMARY  Visits from Start of Care: 3  Current functional level related to goals / functional outcomes: See below   Remaining deficits: See below   Education / Equipment: HEP  Plan:  Patient goals were  met. Patient is being discharged due to meeting PT goals and feels back to baseline. I did discuss we will keep his case open 30 days to allow to return if he has flare up, after 30 days he will need new MD referral.      Patient Name: Justin Ochoa MRN: [id number] DOB:June 21, 1991, 31 y.o., male Today's Date: 08/26/2022  END OF SESSION:  PT End of Session - 08/26/22 1012     Visit Number 3    Number of Visits 4    Date for PT Re-Evaluation 09/17/22    Authorization Type UHC    Authorization - Number of Visits 19    PT Start Time 1012    PT Stop Time 1040    PT Time Calculation (min) 28 min    Activity Tolerance Patient tolerated treatment well    Behavior During Therapy WFL for tasks assessed/performed              History reviewed. No pertinent past medical history. History reviewed. No pertinent surgical history. There are no problems to display for this patient.   PCP: no PCP  REFERRING PROVIDER: Leandrew Koyanagi, MD   REFERRING DIAG: M54.9 (ICD-10-CM) - Upper back pain  THERAPY DIAG:  Pain in thoracic spine  Cervicalgia  Chronic right shoulder pain  Rationale for Evaluation and Treatment: Rehabilitation  ONSET DATE:   SUBJECTIVE:                                                                                                                                                                                      SUBJECTIVE STATEMENT: He relays the pain and tightness feels so much better.   PERTINENT HISTORY: Chronic pain  PAIN:  Are you having pain? Yes: NPRS scale: 1/10 Pain location: Rt upper trap, scapular region, neck Pain description: burning, tightness Aggravating  factors: turing head, golf sometimes  Relieving factors: heat  PRECAUTIONS: None  WEIGHT BEARING RESTRICTIONS: No  FALLS:  Has patient fallen in last 6 months? No  OCCUPATION:   PLOF: Independent  PATIENT GOALS:reduce pain, learn how to manage this better long term  NEXT MD VISIT:   OBJECTIVE:   DIAGNOSTIC FINDINGS:  Thoracic MRI 12/20/21 IMPRESSION: 1. Small soft right paracentral disc protrusion at T5-6 without significant stenosis. 2. Underlying mild dextroscoliosis. 3. Otherwise unremarkable and normal MRI of the thoracic spine.    Cervical  MRI report 12/20/2021: 1. Right paramedian soft disc protrusion at C5-6 with mild right foraminal narrowing. 2. Asymmetric right-sided uncovertebral spurring at C4-5 without significant stenosis.  PATIENT SURVEYS:  FOTO 63% funcitonal, goal is 75% FOTO 94% 08/26/22  COGNITION: Overall cognitive status: Within functional limits for tasks assessed     SENSATION: WFL  POSTURE: WFL  UPPER EXTREMITY ROM:   Active ROM Right eval Left eval Right/Left 08/21/22 08/26/22  Shoulder flexion WNL WNL  WNL  Shoulder extension    WNL  Shoulder abduction WNL WNL  WNL  Shoulder adduction      Shoulder internal rotation WNL WNL  WNL  Shoulder external rotation WNL WNl  WNL        Neck flexion 30  40 WNL  Neck extension 40  WNL WNL  Neck lateral flexion 20 20 30/30 WNL        Neck rotation  60 55 65/65 WFL                    (Blank rows = not tested)  UPPER EXTREMITY MMT:  MMT Right eval Left eval  Shoulder flexion 5 5  Shoulder extension    Shoulder abduction 5 5  Shoulder adduction    Shoulder internal rotation 5 5  Shoulder external rotation 5 5  Middle trapezius    Lower trapezius    Elbow flexion    Elbow extension    Wrist flexion    Wrist extension    Wrist ulnar deviation    Wrist radial deviation    Wrist pronation    Wrist supination    Grip strength (lbs)    (Blank rows = not tested)  SHOULDER  SPECIAL TESTS:  JOINT MOBILITY TESTING:    PALPATION:  Tender to palpation and trigger points Rt rhomboids, infraspinatus, subscapularis, upper trap   TODAY'S TREATMENT:  08/26/22 Manual therapy for skilled palpation and Trigger Point Dry-Needling  Treatment instructions: Expect mild to moderate muscle soreness. Patient Consent Given: Yes Education handout provided:  provided Muscles treated:  Lt upper trap muscle belly and insertion, Lt subscapularis and rhomboid/mid trap, Left supoccipitals/paraspinals  Treatment response/outcome: good overall tolerance, twitch response in muscle noted. No adverse reactions immediately observed   Discussed discharge today and will cancel his last visit as he has met PT goals and feels back to baseline. Recommended continued stretching program and proper warm up before activities such as golf.    PATIENT EDUCATION: Education details: HEP, PT plan of care Person educated: Patient Education method: Explanation, Demonstration, Verbal cues, and Handouts Education comprehension: verbalized understanding and needs further education   HOME EXERCISE PROGRAM: Access Code: [email or web address] URL: [email or web address] Date: 08/06/2022 Prepared by: Elsie Ra  Exercises - Seated Thoracic Extension with Hands Behind Neck  - 2 x daily - 6 x weekly - 1-2 sets - 10 reps - 5 sec hold - Seated Assisted Cervical Rotation with Towel  - 2 x daily - 6 x weekly - 1 sets - 10 reps - 5 hold - Mid-Lower Cervical Extension SNAG with Strap  - 2 x daily - 6 x weekly - 1 sets - 10 reps - 5 sec hold - Seated Passive Cervical Retraction  - 2 x daily - 6 x weekly - 1 sets - 10 reps - 5 sec hold - Seated Cervical Sidebending Stretch  - 2 x daily - 6 x weekly - 1 sets - 2-3 reps - 30 sec hold  ASSESSMENT:  CLINICAL IMPRESSION: He has made  excellent progress with PT and is no longer having the pain and tightness he was having. He has met PT goals and will be  discharged today. He can certainly come back within 30 days if he has any kind of set back.  OBJECTIVE IMPAIRMENTS: decreased activity tolerance, decreased neck mobility, decreased ROM,  impaired flexibility, impaired UE use, postural dysfunction, and pain.  ACTIVITY LIMITATIONS: reaching, lifting, carry,  cleaning, driving, golf  PERSONAL FACTORS: chronic pain also affecting patient's functional outcome.  REHAB POTENTIAL: Good  CLINICAL DECISION MAKING: Stable/uncomplicated  EVALUATION COMPLEXITY: Low    GOALS: Short term PT Goals Target date: 09/03/2022   Pt will be I and compliant with HEP. Baseline:  Goal status: MET Pt will decrease pain by 25% overall Baseline: Goal status: MET  Long term PT goals Target date:09/17/2022   Pt will improve neck AROM to [facility] to improve functional scanning of his enviornment Baseline: Goal status: MET Pt will improve FOTO to at least 75% functional to show improved function Baseline: Goal status: MET Pt will reduce pain to overall less than 50% with usual activity and work activity. Baseline: Goal status: MET  PLAN: PT FREQUENCY: 1 times per week   PT DURATION:4-6 weeks  PLANNED INTERVENTIONS (unless contraindicated): aquatic PT, Canalith repositioning, cryotherapy, Electrical stimulation, Iontophoresis with 4 mg/ml dexamethasome, Moist heat, traction, Ultrasound, gait training, Therapeutic exercise, balance training, neuromuscular re-education, patient/family education, prosthetic training, manual techniques, passive ROM, dry needling, taping, vasopnuematic device, vestibular, spinal manipulations, joint manipulations  PLAN FOR NEXT SESSION: DC today and hold case open 30 days   Debbe Odea, PT,DPT 08/26/2022, 10:51 AM

## 2022-09-03 ENCOUNTER — Encounter: Payer: 59 | Admitting: Physical Therapy

## 2022-11-23 ENCOUNTER — Encounter: Payer: Self-pay | Admitting: Otolaryngology

## 2022-11-27 NOTE — Discharge Instructions (Signed)
[facility] ENDOSCOPIC SINUS SURGERY [facility]  What is Functional Endoscopic Sinus Surgery?  The Surgery involves making the natural openings of the sinuses larger by removing the bony partitions that separate the sinuses from the nasal cavity.  The natural sinus lining is preserved as much as possible to allow the sinuses to resume normal function after the surgery.  In some patients nasal polyps (excessively swollen lining of the sinuses) may be removed to relieve obstruction of the sinus openings.  The surgery is performed through the nose using lighted scopes, which eliminates the need for incisions on the face.  A septoplasty is a different procedure which is sometimes performed with sinus surgery.  It involves straightening the boy partition that separates the two sides of your nose.  A crooked or deviated septum may need repair if is obstructing the sinuses or nasal airflow.  Turbinate reduction is also often performed during sinus surgery.  The turbinates are bony proturberances from the side walls of the nose which swell and can obstruct the nose in patients with sinus and allergy problems.  Their size can be surgically reduced to help relieve nasal obstruction.  What Can Sinus Surgery Do For Me?  Sinus surgery can reduce the frequency of sinus infections requiring antibiotic treatment.  This can provide improvement in nasal congestion, post-nasal drainage, facial pressure and nasal obstruction.  Surgery will NOT prevent you from ever having an infection again, so it usually only for patients who get infections 4 or more times yearly requiring antibiotics, or for infections that do not clear with antibiotics.  It will not cure nasal allergies, so patients with allergies may still require medication to treat their allergies after surgery. Surgery may improve headaches related to sinusitis, however, some people will continue to  require medication to control sinus headaches related to allergies.  Surgery will do nothing for other forms of headache (migraine, tension or cluster).  What Are the Risks of Endoscopic Sinus Surgery?  Current techniques allow surgery to be performed safely with little risk, however, there are rare complications that patients should be aware of.  Because the sinuses are located around the eyes, there is risk of eye injury, including blindness, though again, this would be quite rare. This is usually a result of bleeding behind the eye during surgery, which can effect vision, though there are treatments to protect the vision and prevent permanent injury. More serious complications would include bleeding inside the brain cavity or damage to the brain.This happens when the fluid around the brain leaks out into the sinus cavity.  Again, all of these complications are uncommon, and spinal fluid leaks can be safely managed surgically if they occur.  The most common complication of sinus surgery is bleeding from the nose, which may require packing or cauterization of the nose.  Patients with polyps may experience recurrence of the polyps that would require revision surgery.  Alterations of sense of smell or injury to the tear ducts are also rare complications.   What is the Surgery Like, and what is the Recovery?  The Surgery usually takes a couple of hours to perform, and is usually performed under a general anesthetic (completely asleep).  Patients are usually discharged home after a couple of hours.  Sometimes during surgery it is necessary to pack the nose to control bleeding, and the packing is left in place for 24 - 48 hours, and removed by your surgeon.  If   a septoplasty was performed during the procedure, there is often a splint placed which must be removed after 5-7 days.   Discomfort: Pain is usually mild to moderate, and can be controlled by prescription pain medication or acetaminophen (Tylenol).   Aspirin, Ibuprofen (Advil, Motrin), or Naprosyn (Aleve) should be avoided, as they can cause increased bleeding.  Most patients feel sinus pressure like they have a bad head cold for several days.  Sleeping with your head elevated can help reduce swelling and facial pressure, as can ice packs over the face.  A humidifier may be helpful to keep the mucous and blood from drying in the nose.   Diet: There are no specific diet restrictions, however, you should generally start with clear liquids and a light diet of bland foods because the anesthetic can cause some nausea.  Advance your diet depending on how your stomach feels.  Taking your pain medication with food will often help reduce stomach upset which pain medications can cause.  Nasal Saline Irrigation: It is important to remove blood clots and dried mucous from the nose as it is healing.  This is done by having you irrigate the nose at least 3 - 4 times daily with a salt water solution.  We recommend using NeilMed Sinus Rinse (available at the drug store).  Fill the squeeze bottle with the solution, bend over a sink, and insert the tip of the squeeze bottle into the nose  of an inch.  Point the tip of the squeeze bottle towards the inside corner of the eye on the same side your irrigating.  Squeeze the bottle and gently irrigate the nose.  If you bend forward as you do this, most of the fluid will flow back out of the nose, instead of down your throat.   The solution should be warm, near body temperature, when you irrigate.   Each time you irrigate, you should use a full squeeze bottle.   Note that if you are instructed to use Nasal Steroid Sprays at any time after your surgery, irrigate with saline BEFORE using the steroid spray, so you do not wash it all out of the nose. Another product, Nasal Saline Gel (such as AYR Nasal Saline Gel) can be applied in each nostril 3 - 4 times daily to moisture the nose and reduce scabbing or crusting.  Bleeding:   Bloody drainage from the nose can be expected for several days, and patients are instructed to irrigate their nose frequently with salt water to help remove mucous and blood clots.  The drainage may be dark red or brown, though some fresh blood may be seen intermittently, especially after irrigation.  Do not blow you nose, as bleeding may occur. If you must sneeze, keep your mouth open to allow air to escape through your mouth.  If heavy bleeding occurs: Irrigate the nose with saline to rinse out clots, then spray the nose 3 - 4 times with Afrin Nasal Decongestant Spray.  The spray will constrict the blood vessels to slow bleeding.  Pinch the lower half of your nose shut to apply pressure, and lay down with your head elevated.  Ice packs over the nose may help as well. If bleeding persists despite these measures, you should notify your doctor.  Do not use the Afrin routinely to control nasal congestion after surgery, as it can result in worsening congestion and may affect healing.     Activity: Return to work varies among patients. Most patients will be out   of work at least 5 - 7 days to recover.  Patient may return to work after they are off of narcotic pain medication, and feeling well enough to perform the functions of their job.  Patients must avoid heavy lifting (over 10 pounds) or strenuous physical for 2 weeks after surgery, so your employer may need to assign you to light duty, or keep you out of work longer if light duty is not possible.  NOTE: you should not drive, operate dangerous machinery, do any mentally demanding tasks or make any important legal or financial decisions while on narcotic pain medication and recovering from the general anesthetic.    Call Your Doctor Immediately if You Have Any of the Following: Bleeding that you cannot control with the above measures Loss of vision, double vision, bulging of the eye or black eyes. Fever over 101 degrees Neck stiffness with severe headache,  fever, nausea and change in mental state. You are always encouraged to call anytime with concerns, however, please call with requests for pain medication refills during office hours.  Office Endoscopy: During follow-up visits your doctor will remove any packing or splints that may have been placed and evaluate and clean your sinuses endoscopically.  Topical anesthetic will be used to make this as comfortable as possible, though you may want to take your pain medication prior to the visit.  How often this will need to be done varies from patient to patient.  After complete recovery from the surgery, you may need follow-up endoscopy from time to time, particularly if there is concern of recurrent infection or nasal polyps.  

## 2022-12-01 ENCOUNTER — Encounter: Payer: Self-pay | Admitting: Otolaryngology

## 2022-12-01 ENCOUNTER — Ambulatory Visit
Admission: RE | Admit: 2022-12-01 | Discharge: 2022-12-01 | Disposition: A | Discharge status: Home / Self Care | Payer: 59 | Attending: Otolaryngology | Admitting: Otolaryngology

## 2022-12-01 ENCOUNTER — Other Ambulatory Visit: Payer: Self-pay

## 2022-12-01 ENCOUNTER — Ambulatory Visit: Payer: 59 | Admitting: Anesthesiology

## 2022-12-01 ENCOUNTER — Encounter
Admission: RE | Disposition: A | Discharge status: Home / Self Care | Payer: Self-pay | Source: Home / Self Care | Attending: Otolaryngology

## 2022-12-01 DIAGNOSIS — H6990 Unspecified Eustachian tube disorder, unspecified ear: Secondary | ICD-10-CM | POA: Insufficient documentation

## 2022-12-01 DIAGNOSIS — J329 Chronic sinusitis, unspecified: Secondary | ICD-10-CM | POA: Insufficient documentation

## 2022-12-01 DIAGNOSIS — J342 Deviated nasal septum: Secondary | ICD-10-CM | POA: Diagnosis not present

## 2022-12-01 DIAGNOSIS — J343 Hypertrophy of nasal turbinates: Secondary | ICD-10-CM | POA: Diagnosis not present

## 2022-12-01 HISTORY — DX: Other allergy status, other than to drugs and biological substances: Z91.09

## 2022-12-01 HISTORY — PX: ETHMOIDECTOMY: SHX5197

## 2022-12-01 HISTORY — PX: MAXILLARY ANTROSTOMY: SHX2003

## 2022-12-01 HISTORY — PX: IMAGE GUIDED SINUS SURGERY: SHX6570

## 2022-12-01 HISTORY — PX: FRONTAL SINUS EXPLORATION: SHX6591

## 2022-12-01 HISTORY — PX: NASAL SEPTOPLASTY W/ TURBINOPLASTY: SHX2070

## 2022-12-01 SURGERY — SINUS SURGERY, WITH IMAGING GUIDANCE
Anesthesia: General | Laterality: Bilateral

## 2022-12-01 MED ORDER — OXYMETAZOLINE HCL 0.05 % NA SOLN
NASAL | Status: DC | PRN
  Administered 2022-12-01: 1 via TOPICAL

## 2022-12-01 MED ORDER — SUCCINYLCHOLINE CHLORIDE 200 MG/10ML IV SOSY
PREFILLED_SYRINGE | INTRAVENOUS | Status: DC | PRN
  Administered 2022-12-01: 100 mg via INTRAVENOUS

## 2022-12-01 MED ORDER — CEFDINIR 300 MG PO CAPS: 300.0000 mg | ORAL_CAPSULE | Freq: Two times a day (BID) | ORAL | 0 refills | Status: AC

## 2022-12-01 MED ORDER — SODIUM CHLORIDE 0.9 % IR SOLN
Status: DC | PRN
  Administered 2022-12-01: 250 mL

## 2022-12-01 MED ORDER — LACTATED RINGERS IV SOLN: INTRAVENOUS | Status: DC

## 2022-12-01 MED ORDER — PROPOFOL 10 MG/ML IV BOLUS
INTRAVENOUS | Status: DC | PRN
  Administered 2022-12-01: 180 mg via INTRAVENOUS

## 2022-12-01 MED ORDER — PREDNISONE 10 MG (21) PO TBPK: ORAL_TABLET | ORAL | 0 refills | Status: AC

## 2022-12-01 MED ORDER — ACETAMINOPHEN 160 MG/5ML PO SOLN: 960.0000 mg | Freq: Once | ORAL | Status: AC

## 2022-12-01 MED ORDER — LIDOCAINE-EPINEPHRINE 1 %-1:100000 IJ SOLN
INTRAMUSCULAR | Status: DC | PRN
  Administered 2022-12-01: 10 mL

## 2022-12-01 MED ORDER — ACETAMINOPHEN 500 MG PO TABS
1000.0000 mg | ORAL_TABLET | Freq: Once | ORAL | Status: AC
  Administered 2022-12-01: 1000 mg via ORAL

## 2022-12-01 MED ORDER — DEXAMETHASONE SODIUM PHOSPHATE 4 MG/ML IJ SOLN
INTRAMUSCULAR | Status: DC | PRN
  Administered 2022-12-01: 4 mg via INTRAVENOUS

## 2022-12-01 MED ORDER — FENTANYL CITRATE PF 50 MCG/ML IJ SOSY
25.0000 ug | PREFILLED_SYRINGE | INTRAMUSCULAR | Status: DC | PRN
  Administered 2022-12-01: 25 ug via INTRAVENOUS

## 2022-12-01 MED ORDER — 0.9 % SODIUM CHLORIDE (POUR BTL) OPTIME
TOPICAL | Status: DC | PRN
  Administered 2022-12-01: 500 mL

## 2022-12-01 MED ORDER — OXYCODONE HCL 5 MG PO TABS: 5.0000 mg | ORAL_TABLET | Freq: Once | ORAL | Status: AC | PRN

## 2022-12-01 MED ORDER — STERILE WATER FOR IRRIGATION IR SOLN
Status: DC | PRN
  Administered 2022-12-01: 250 mL

## 2022-12-01 MED ORDER — ONDANSETRON HCL 4 MG/2ML IJ SOLN
4.0000 mg | Freq: Once | INTRAMUSCULAR | Status: AC | PRN
  Administered 2022-12-01: 4 mg via INTRAVENOUS

## 2022-12-01 MED ORDER — HYDROCODONE-ACETAMINOPHEN 5-325 MG PO TABS: 1.0000 | ORAL_TABLET | Freq: Four times a day (QID) | ORAL | 0 refills | Status: AC | PRN

## 2022-12-01 MED ORDER — FENTANYL CITRATE (PF) 100 MCG/2ML IJ SOLN
INTRAMUSCULAR | Status: DC | PRN
  Administered 2022-12-01: 50 ug via INTRAVENOUS
  Administered 2022-12-01 (×2): 25 ug via INTRAVENOUS

## 2022-12-01 MED ORDER — OXYCODONE HCL 5 MG/5ML PO SOLN
5.0000 mg | Freq: Once | ORAL | Status: AC | PRN
  Administered 2022-12-01: 5 mg via ORAL

## 2022-12-01 MED ORDER — ONDANSETRON HCL 4 MG/2ML IJ SOLN
INTRAMUSCULAR | Status: DC | PRN
  Administered 2022-12-01: 4 mg via INTRAVENOUS

## 2022-12-01 MED ORDER — MIDAZOLAM HCL 5 MG/5ML IJ SOLN
INTRAMUSCULAR | Status: DC | PRN
  Administered 2022-12-01: 2 mg via INTRAVENOUS

## 2022-12-01 SURGICAL SUPPLY — 42 items
ANTIFOG SOL W/FOAM PAD STRL (MISCELLANEOUS) ×1
BLADE SHAVER TRUDI 4 15 DEG (ENT DISPOSABLE) ×1 IMPLANT
BLADE SHAVER TRUDI STR 4 (ENT DISPOSABLE) ×1 IMPLANT
CABLE TRUDI DISPOSABLE (ENT DISPOSABLE) ×2 IMPLANT
CANISTER SUCT 1200ML W/VALVE (MISCELLANEOUS) ×1 IMPLANT
COAG SUCTION FOOTSWITCH 10FR (SUCTIONS) ×1 IMPLANT
COAGULATOR SUCT 8FR VV (MISCELLANEOUS) ×1 IMPLANT
DEVICE INFLATION SEID (MISCELLANEOUS) IMPLANT
DRESSING NASL FOAM PST OP SINU (MISCELLANEOUS) IMPLANT
DRSG NASAL FOAM POST OP SINU (MISCELLANEOUS)
ELECT REM PT RETURN 9FT ADLT (ELECTROSURGICAL) ×1
ELECTRODE REM PT RTRN 9FT ADLT (ELECTROSURGICAL) ×1 IMPLANT
GAUZE SPONGE 4X4 12PLY STRL (GAUZE/BANDAGES/DRESSINGS) IMPLANT
GLOVE SURG ENC MOIS LTX SZ7.5 (GLOVE) ×2 IMPLANT
GOWN STRL REUS W/ TWL LRG LVL3 (GOWN DISPOSABLE) ×1 IMPLANT
GOWN STRL REUS W/TWL LRG LVL3 (GOWN DISPOSABLE) ×1
IV NS 500ML (IV SOLUTION) ×1
IV NS 500ML BAXH (IV SOLUTION) ×1 IMPLANT
KIT TURNOVER KIT A (KITS) ×1 IMPLANT
NDL HYPO 25GX1X1/2 BEV (NEEDLE) ×1 IMPLANT
NEEDLE HYPO 25GX1X1/2 BEV (NEEDLE) ×1 IMPLANT
NS IRRIG 500ML POUR BTL (IV SOLUTION) ×1 IMPLANT
PACK ENT CUSTOM (PACKS) ×1 IMPLANT
PACKING NASAL EPIS 4X2.4 XEROG (MISCELLANEOUS) IMPLANT
PATTIES SURGICAL .5 X3 (DISPOSABLE) ×1 IMPLANT
SOLUTION ANTFG W/FOAM PAD STRL (MISCELLANEOUS) IMPLANT
SPLINT NASAL SEPTAL PRE-CUT (MISCELLANEOUS) ×1 IMPLANT
STRAP BODY AND KNEE 60X3 (MISCELLANEOUS) IMPLANT
SUCTION COAG ELEC 10 HAND CTRL (ELECTROSURGICAL) ×1 IMPLANT
SUT CHROMIC 4 0 RB 1X27 (SUTURE) IMPLANT
SUT ETHILON 4-0 (SUTURE) ×1
SUT ETHILON 4-0 FS2 18XMFL BLK (SUTURE) ×1
SUT PLAIN GUT 4-0 (SUTURE) IMPLANT
SUTURE ETHLN 4-0 FS2 18XMF BLK (SUTURE) IMPLANT
SYR 10ML LL (SYRINGE) ×1 IMPLANT
SYSTEM BALLOON SINUPLASTY 6X16 (SINUPLASTY) IMPLANT
TOWEL OR 17X26 4PK STRL BLUE (TOWEL DISPOSABLE) ×1 IMPLANT
TRACKER DISPOSABLE PAITIENT (MISCELLANEOUS) ×1 IMPLANT
TRACKER TRUDI DISPOSABLE (ENT DISPOSABLE) ×1 IMPLANT
TUBING CONNECTING 10 (TUBING) ×1 IMPLANT
TUBING IRRIGATION BIEN-AIR (TUBING) ×1 IMPLANT
WATER STERILE IRR 250ML POUR (IV SOLUTION) ×1 IMPLANT

## 2022-12-01 NOTE — Transfer of Care (Signed)
Immediate Anesthesia Transfer of Care Note  Patient: Justin Ochoa  Procedure(s) Performed: IMAGE GUIDED SINUS SURGERY (Bilateral) NASAL SEPTOPLASTY WITH TURBINATE REDUCTION (SUBMUCUSAL) (Bilateral) MAXILLARY ANTROSTOMY WITH TISSUE REMOVAL (Bilateral) TOTAL ETHMOIDECTOMY (Bilateral) FRONTAL SINUS EXPLORATION (Bilateral)  Patient Location: PACU  Anesthesia Type: General  Level of Consciousness: awake, alert  and patient cooperative  Airway and Oxygen Therapy: Patient Spontanous Breathing and Patient connected to supplemental oxygen  Post-op Assessment: Post-op Vital signs reviewed, Patient's Cardiovascular Status Stable, Respiratory Function Stable, Patent Airway and No signs of Nausea or vomiting  Post-op Vital Signs: Reviewed and stable  Complications: No notable events documented.

## 2022-12-01 NOTE — Op Note (Signed)
12/01/2022  1:07 PM    Warren Danes  [id number]   Pre-Op Diagnosis:  1) Sinusitis, chronic, 2) Deviated nasal septum, 3) Inferior turbinate hypertrophy  Post-op Diagnosis: Same  Procedure:  1)  Image Guided Sinus Surgery,   2)  Bilateral Endoscopic Maxillary Antrostomy with Tissue Removal   3)  Bilateral Frontal Sinusotomy   4)  Bilateral Total Ethmoidectomy   5)  Nasal Septoplasty   6)  Bilateral submucous resection of the inferior turbinates    Surgeon:  Riley Nearing  Anesthesia:  General endotracheal  EBL:  [id number]   Complications:  None  Findings: left septal deviation, bilateral inferior turbinate hypertrophy, obstructive polypoid mucosal thickening of the sinuses  Procedure: After the patient was identified in holding and the benefits of the procedure were reviewed as well as the consent and risks, the patient was taken to the operating room and with the patient in a comfortable supine position,  general orotracheal anesthesia was induced without difficulty.  A proper time-out was performed.  The Trudi image guidance system was set up and calibrated in the normal fashion and felt to be acceptable.  Next 1% Xylocaine with 1:100,000 epinephrine was infiltrated into the inferior turbinates, septum, and anterior middle turbinates and uncinate region  bilaterally.  Several minutes were allowed for this to take effect.  Cottonoid pledgets soaked in Afrin were placed into both nasal cavities and left while the patient was prepped and draped in the standard fashion. The image guided suction was calibrated and used to inspect known points in the nasal cavity to assess accuracy of the image guided system. Accuracy was felt to be excellent.   Beginning on the left hand side a hemitransfixion incision was then created on the leading edge of the septum on the left.  A subperichondrial plane was elevated posteriorly on the left and taken back to the perpendicular plate of the ethmoid where  subperiosteal plane was elevated posteriorly on the left. A large septal spur was identified on the left hand side.  An inferior rim of redundant septal cartilage was removed from where it deviated over the maxillary crest. The perpendicular plate of the ethmoid was separated from the quadrangular cartilage, and a subperiosteal plane elevated on the right of the bony septum. The large septal spur was removed, dividing the septal bone superiorly with Knight scissors, and inferiorly from the maxillary crest with a chisel.    The septum was then replaced in the midline. Reinspection through each nostril showed excellent reduction of the septal deformity. A left posterior inferior fenestration was then created to allow hematoma drainage.  The septal incision was closed with 4-0 chromic gut suture. A 4-0 plain gut suture was used to reapproximate the septal flaps to the underlying cartilage, utilizing a running, quilting type stitch.   The left middle turbinate was medialized and the uncinate process then resected with through-cutting forceps as well as the microdebrider. In this fashion the uncinate was completely removed along with soft tissue and bone of the medial wall of the maxillary sinus to create a large patent maxillary antrostomy. The left maxillary sinus was suctioned to clear secretions.   Next the left anterior ethmoid sinuses were dissected beginning inferomedially, entering the ethmoid bulla. Thru cut forceps were used to open the anterior ethmoids. The microdebrider was used as needed to trim loose mucosal edges.  Next the basal lamella was entered and, working back in a sequential fashion through the ethmoid air cells, the ethmoid sinuses were  dissected to the posterior ethmoid sinuses, utilizing the image guided suction to reassess the anatomy frequently. Thru cutting forceps were used for this dissection. Care was taken to avoid injury to the lamina papyracea laterally and the skull base  superiorly.   Dissection proceeded anteriorly and superiorly into the left frontal recess which was dissected utilizing a 30 scope and frontal curved instruments. The curved image guided suction was used during this dissection to frequently reassess the anatomy on the CT scan. The frontal recess was dissected until a suction could be passed up into the region of the frontal sinus.   Attention was then turned to the right side where the same procedure was performed, opening the maxillary sinus, ethmoid cavities, and the frontal recess in the same fashion as described above.   Next, beginning on the left-hand side, a 15 blade was used to incise along the inferior edge of the inferior turbinate. A superior laterally based flap of the medial turbinate mucosa was then elevated. A portion of the underlying conchal bone and lateral mucosa was excised using Knight scissors. The flap was then laid back over the turbinate stump and the bleeding edge of the turbinate cauterized using suction cautery. In a similar fashion the submucous resection was performed on the right.  With the submucous resection completed bilaterally and no active bleeding, nasal septal splints were placed within each nostril and affixed to the septum using a 3-0 nylon suture.   The nose was suctioned and inspected. The maxillary sinuses were irrigated with saline. Xerogel absorbable sinus packing was then placed in the ethmoid cavities bilaterally.   The patient was then returned to the anesthesiologist for awakening and taken to recovery room in good condition postoperatively.  Disposition:   PACU and d/c home  Plan: Ice, elevation, narcotic analgesia and prophylactic antibiotics. Begin sinus irrigations with saline tomorrow, irrigating 3-4 times daily. Return to the office in 7 days for splint removal.  Return to work in 7-10 days, no strenuous activities for two weeks.   Riley Nearing 12/01/2022 1:07 PM

## 2022-12-01 NOTE — Anesthesia Preprocedure Evaluation (Signed)
Anesthesia Evaluation  Patient identified by MRN, date of birth, ID band Patient awake    Reviewed: Allergy & Precautions, NPO status , Patient's Chart, lab work & pertinent test results  History of Anesthesia Complications Negative for: history of anesthetic complications  Airway Mallampati: I  TM Distance: >3 FB Neck ROM: Full    Dental no notable dental hx. (+) Teeth Intact   Pulmonary neg pulmonary ROS, neg sleep apnea, neg COPD, Patient abstained from smoking.Not current smoker   Pulmonary exam normal breath sounds clear to auscultation       Cardiovascular Exercise Tolerance: Good METS(-) hypertension(-) CAD and (-) Past MI negative cardio ROS (-) dysrhythmias  Rhythm:Regular Rate:Normal - Systolic murmurs    Neuro/Psych negative neurological ROS  negative psych ROS   GI/Hepatic ,neg GERD  ,,(+)     (-) substance abuse    Endo/Other  neg diabetes    Renal/GU negative Renal ROS     Musculoskeletal   Abdominal   Peds  Hematology   Anesthesia Other Findings Past Medical History: No date: Environmental allergies  Reproductive/Obstetrics                             Anesthesia Physical Anesthesia Plan  ASA: 1  Anesthesia Plan: General   Post-op Pain Management: Tylenol PO (pre-op) and Fentanyl IV   Induction: Intravenous  PONV Risk Score and Plan: 3 and Ondansetron, Dexamethasone and Midazolam  Airway Management Planned: Oral ETT  Additional Equipment: None  Intra-op Plan:   Post-operative Plan: Extubation in OR  Informed Consent: I have reviewed the patients History and Physical, chart, labs and discussed the procedure including the risks, benefits and alternatives for the proposed anesthesia with the patient or authorized representative who has indicated his/her understanding and acceptance.     Dental advisory given  Plan Discussed with: CRNA and  Surgeon  Anesthesia Plan Comments: (Discussed risks of anesthesia with patient, including PONV, sore throat, lip/dental/eye damage. Rare risks discussed as well, such as cardiorespiratory and neurological sequelae, and allergic reactions. Discussed the role of CRNA in patient's perioperative care. Patient understands.)       Anesthesia Quick Evaluation

## 2022-12-01 NOTE — Anesthesia Postprocedure Evaluation (Signed)
Anesthesia Post Note  Patient: Justin Ochoa  Procedure(s) Performed: IMAGE GUIDED SINUS SURGERY (Bilateral) NASAL SEPTOPLASTY WITH TURBINATE REDUCTION (SUBMUCUSAL) (Bilateral) MAXILLARY ANTROSTOMY WITH TISSUE REMOVAL (Bilateral) TOTAL ETHMOIDECTOMY (Bilateral) FRONTAL SINUS EXPLORATION (Bilateral)  Patient location during evaluation: PACU Anesthesia Type: General Level of consciousness: awake and alert Pain management: pain level controlled Vital Signs Assessment: post-procedure vital signs reviewed and stable Respiratory status: spontaneous breathing, nonlabored ventilation, respiratory function stable and patient connected to nasal cannula oxygen Cardiovascular status: blood pressure returned to baseline and stable Postop Assessment: no apparent nausea or vomiting Anesthetic complications: no   No notable events documented.   Last Vitals:  Vitals:   12/01/22 1330 12/01/22 1335  BP: (!) 140/94   Pulse: (!) 101 98  Resp: 10 13  Temp:    SpO2: 93% 93%    Last Pain:  Vitals:   12/01/22 1329  TempSrc:   PainSc: 7                  Arita Miss

## 2022-12-01 NOTE — H&P (Signed)
History and physical reviewed and will be scanned in later. No change in medical status reported by the patient or family, appears stable for surgery. All questions regarding the procedure answered, and patient (or family if a child) expressed understanding of the procedure. ? ?Justin Ochoa S Khaliel Morey ?@TODAY@ ?

## 2022-12-02 ENCOUNTER — Encounter: Payer: Self-pay | Admitting: Otolaryngology

## 2022-12-03 LAB — SURGICAL PATHOLOGY

## 2023-07-26 ENCOUNTER — Ambulatory Visit (INDEPENDENT_AMBULATORY_CARE_PROVIDER_SITE_OTHER): Payer: 59 | Admitting: Orthopedic Surgery

## 2023-07-26 ENCOUNTER — Encounter: Payer: Self-pay | Admitting: Orthopedic Surgery

## 2023-07-26 ENCOUNTER — Other Ambulatory Visit (INDEPENDENT_AMBULATORY_CARE_PROVIDER_SITE_OTHER): Payer: 59

## 2023-07-26 DIAGNOSIS — M25572 Pain in left ankle and joints of left foot: Secondary | ICD-10-CM

## 2023-07-26 NOTE — Progress Notes (Signed)
Office Visit Note   Patient: Justin Ochoa           Date of Birth: 1990-09-25           MRN: [id number] Visit Date: 07/26/2023              Requested by: No referring provider defined for this encounter. PCP: Clovis Riley, L.August Saucer, MD (Inactive)  Chief Complaint  Patient presents with   Left Foot - Pain      HPI: Patient is a 32 year old gentleman is seen for initial evaluation for pain beneath the ball of the left foot third and fourth metatarsals with pain that radiates into the third webspace.  Assessment & Plan: Visit Diagnoses:  1. Pain in left ankle and joints of left foot     Plan: Recommended a stiff soled sneaker.  Recommended and demonstrated Achilles stretching.  Recommended a sole cork orthotic with a metatarsal pad.  Discussed that if he fails this treatment we could proceed with an injection of the third webspace.  Follow-Up Instructions: Return if symptoms worsen or fail to improve.   Ortho Exam  Patient is alert, oriented, no adenopathy, well-dressed, normal affect, normal respiratory effort. Examination patient is a good dorsalis pedis pulse he has good ankle and subtalar motion with dorsiflexion of the ankle 10 degrees past neutral.  Patient has callus beneath the second third and fourth metatarsal heads consistent with pressure overloading.  He does have clawing of the lesser toes.  Patient has tenderness to palpation beneath the third and fourth metatarsal head and palpation of the third webspace reproduces numbness and tingling in the third webspace.  Lateral compression of the third webspace does not reproduce a clunk.  Imaging: XR Foot Complete Left  Result Date: 07/26/2023 Three-view radiographs of the left foot shows a long second third and fourth metatarsal.  No images are attached to the encounter.  Labs: No results found for: "HGBA1C", "ESRSEDRATE", "CRP", "LABURIC", "REPTSTATUS", "GRAMSTAIN", "CULT", "LABORGA"   No results found for: "ALBUMIN",  "PREALBUMIN", "CBC"  No results found for: "MG" No results found for: "VD25OH"  No results found for: "PREALBUMIN"     No data to display           There is no height or weight on file to calculate BMI.  Orders:  Orders Placed This Encounter  Procedures   XR Foot Complete Left   No orders of the defined types were placed in this encounter.    Procedures: No procedures performed  Clinical Data: No additional findings.  ROS:  All other systems negative, except as noted in the HPI. Review of Systems  Objective: Vital Signs: There were no vitals taken for this visit.  Specialty Comments:  No specialty comments available.  PMFS History: There are no problems to display for this patient.  Past Medical History:  Diagnosis Date   Environmental allergies     History reviewed. No pertinent family history.  Past Surgical History:  Procedure Laterality Date   ETHMOIDECTOMY Bilateral 12/01/2022   Procedure: TOTAL ETHMOIDECTOMY;  Surgeon: Geanie Logan, MD;  Location: [facility];  Service: ENT;  Laterality: Bilateral;   FRONTAL SINUS EXPLORATION Bilateral 12/01/2022   Procedure: FRONTAL SINUS EXPLORATION;  Surgeon: Geanie Logan, MD;  Location: [facility];  Service: ENT;  Laterality: Bilateral;   IMAGE GUIDED SINUS SURGERY Bilateral 12/01/2022   Procedure: IMAGE GUIDED SINUS SURGERY;  Surgeon: Geanie Logan, MD;  Location: [facility];  Service: ENT;  Laterality: Bilateral;  need  stryker disk   KNEE SURGERY Right    MAXILLARY ANTROSTOMY Bilateral 12/01/2022   Procedure: MAXILLARY ANTROSTOMY WITH TISSUE REMOVAL;  Surgeon: Geanie Logan, MD;  Location: [facility];  Service: ENT;  Laterality: Bilateral;   NASAL SEPTOPLASTY W/ TURBINOPLASTY Bilateral 12/01/2022   Procedure: NASAL SEPTOPLASTY WITH TURBINATE REDUCTION (SUBMUCUSAL);  Surgeon: Geanie Logan, MD;  Location: [facility];  Service: ENT;  Laterality: Bilateral;   Social  History   Occupational History   Not on file  Tobacco Use   Smoking status: Never   Smokeless tobacco: Never  Vaping Use   Vaping status: Never Used  Substance and Sexual Activity   Alcohol use: Yes    Alcohol/week: 6.0 standard drinks of alcohol    Types: 6 Standard drinks or equivalent per week   Drug use: No   Sexual activity: Not on file

## 2024-02-18 NOTE — H&P (View-Only) (Signed)
 SUBJECTIVE:   Chief Complaint: Congestion  HPI: Justin Ochoa is a 33 y.o. male who presents today for consultation at the request of Pcp, None Per Patient for evaluation of  chronic sinusitis.   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   He underwent surgery on 12/01/2022 by Dr. Deward GORMAN Pesa.  Procedure performed: 1) Image guided sinus surgery 2) bilateral endoscopic maxillary antrostomy with tissue removal 3) bilateral frontal ethmoidectomy 4) bilateral total ethmoidectomy 5) nasal septoplasty 6) bilateral submucous resection of the IT  Operative findings: left septal deviation, bilateral IT hypertrophy, obstructive polypoid mucosal thickening of the sinuses.  He was recommended to start sinus irrigations with saline 3-4 times daily.   He was seen for post-op visit in 12/2022 s/p IGSS, septoplasty, bilateral SMR with tissue removal, bilateral total ethmoidectomy, bilateral frontal exploration. He was recommended to do sinus rinses with gentamycin and budesonide to reduce inflammation and to recover from the surgery and also advised to stop nasal steroids while he is on irrigations.   I personally reviewed his nasal endoscopy obtained in 12/2022 and it showed nasal cavity is mildly congested. Mucoid secretions noted bilaterally. There is patency of the maxillary antrostomies and ethmoids with no polyps or crusting but there is some thick yellow mucus in the right  maxillary sinus. The nasopharnyx was clear.  He was seen by Dr.  on 03/01/2023 for follow-up on chronic sinusitis and was informed that his FR areas were horribly inflamed at the time of surgery with difficult bleeding. Recommended to continue Mucinex and saline irrigations in the meantime.  He was informed that some left-sided nasal obstruction  appears to have some progressive shift to the septum back towards the left, despite initially looking relatively straight. Discussed the need of revision sinus surgery.   I personally reviewed his endoscopy obtained on 01/18/2023 and it showed nasal cavity is congested, on the right there is a patent maxillary antrostomy with no purulence. The FR region is a bit narrow but I do not see any purulence there. On the left maxillary antrostomy is also patent but there is frank purulence which was cultured. FR is quite narrow limiting visualization in that area with some narrowing of the ethmoids as well. He has had some post-op shift of his nasal septum back to the left which does narrow the left nasal cavity.   He obtained left maxillary culture on 01/18/2023.   Today, he presents for the evaluation of, He had sinus infection after surgery and feels like he continues to have infection on his left side since then. He continues to have sinus infection since 05/2022 and tried various antibiotics and steroids and underwent surgery after that. He got another sinus infection and feels like different than usual and obtained an allergy testing which was normal except to dog and dander. He was on antibiotics in 02/2023 for 10 days for sinus infection. He is not on any treatment regimen for his nasal and sinus issues. He was doing nasal irrigations for few days and noticed thick mucus and obtained a culture by Dr. Blair which was positive for streptococcus. He tries to blow his nose when he feels congested. States he had minor septal deviation before surgery.  He has sensitivity taking to doxycycline.   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 At the  previous visit, he was started on DDM irrigations BID with Zithromax 250 mg tablet daily for a month.  Today, he presents for follow-up, has been doing well overall. He felt sick and had low grade fever for 8 days and tested negative for any infection. He does sinus rinses with DDM. States his symptoms improved with sinus rinses and antibiotics.  He completed 30 days course of antibiotics. Pain/Pressure: No Sense of smell: Stable Drainage: No Nasal Congestion: No  I personally reviewed the CT scan obtained today and I found postsurgical sequelae of bilateral frontal ethmoidectomy, total ethmoidectomy, nasal septoplasty, maxillary antrostomy, and inferior turbinate resection.Mild mucosal thickening of the bilateral frontal sinuses. Frontal recesses are patent. Ethmoid air cells are clear. Mild mucosal thickening of the left ethmoid sinus. Sphenoethmoidal recesses are patent. Maxillary sinuses are clear. Ostiomeatal units are patent. Mild leftward deviation of the anterior nasal septum. No nasal spur. No skull base dehiscence  Update 08/02/2023: FU Chronic rhinitis, Septal deviation and ITH, AR to dog and dander, and CRS.  At the previous visit, Recommended to continue DDM sinus rinse and for surgery.  Today, he presents for pre-op visit accompanied by his wife. He is scheduled for revision septoplasty with revision endoscopic sinus surgery on 08/17/2023. Has been doing well overall with his symptoms and is unsure whether he should undergo surgery or not.   Update 11/29/2023: FU Chronic rhinitis, Septal deviation and ITH, AR to dog and dander, and CRS. At the previous visit, he was scheduled for revision septoplasty with revision endoscopic sinus surgery on 08/17/2023 but it was cancelled. Also recommended to continue with DDM sinus risne.  Today, he presents for follow-up, has been doing well overall. Reports persistent drainage and hoarseness of voice. Reports congestion of left side is more  bothersome than right side. Reports he constantly needs to blow his nose. Reports his insurance company is not covering the cost of septoplasty and sinus surgery, so he is delaying for the surgery.  Pain/Pressure: No Sense of smell: Stable Drainage: yes, constant Nasal Congestion: yes  I personally reviewed the CT scan obtained on 08/17/2023 and I concur with the findings of mucosal thickening near the floor of the frontal sinuses. Frontal recesses are occluded. Partial ethmoidectomy with mucosal thickening in the remaining ethmoid air cells with multiple opacified air cells. Mild mucosal thickening in the sphenoid sinuses. The right sphenoethmoidal recess is partially obstructed, the left is patent. Postsurgical changes of bilateral maxillary uncinectomies and infundibulectomy's with patent antrostomies.  Partially resected left middle nasal turbinate. Moderate leftward nasal septal deviation. No significant nasal spur. No skull base dehiscence. Sellar pneumatization of the sphenoid sinuses.  Update 02/21/2024: FU Chronic rhinitis, Septal deviation and ITH, AR to dog and dander, and CRS. At the previous visit, Recommended for surgery and to continue with DDM sinus rinse BID. Today, he presents for pre-op visit. He is scheduled for endoscopic sinus surgery and revision septoplasty on 03/14/2024.   Past Medical History He  has no past medical history on file.  Past Surgical History His  has no past surgical history on file.  Past Family History His family history is not on file.  Past Social History He  reports that he has never smoked. He has never  used smokeless tobacco. He reports current alcohol use. He reports that he does not use drugs.  Medications/Allergies/Immunizations His current medication(s) include:   Current Outpatient Medications:  .  azithromycin (ZITHROMAX) 250 MG tablet, Take 1 tablet (250 mg total) by mouth daily., Disp: 28 tablet, Rfl: 1 .  mometasone furoate, bulk,  100 % Powd, 1.2mg /38mL; add 10mL to saline; irrigate with 120mL solution in each nostril BID, Disp: 1 g, Rfl: 11 Allergies: Ciprofloxacin , Cyclobenzaprine , and Doxycycline, Immunizations:  Immunization History  Administered Date(s) Administered  . COVID-19 VACC,MRNA,(PFIZER)(PF) 12/11/2019, 01/02/2020, 08/12/2020      Review of Systems:   His RSDI score today was not obtained, and is documented in the chart. His  SNOT-22 score was not obtained today, and previous was 40, 45, and is documented in the chart.    PHYSICAL EXAM GENERAL APPEARANCE:  Well developed, well nourished.   HEAD AND FACE: No lesions or masses.   ENT  Nose:  Visualization of the middle meatus was limited on anterior evaluation. External inspection of nose reveals no lesions, no masses.  Oral cavity/Oropharynx: Lips and gums are normal.  Oropharynx, including the mucosa of oral cavity, hard and soft palates, tongue, and posterior pharyngeal wall showed normal symmetry without lesion and normal hydration of mucosal surfaces.     Diagnostic Bilateral Flexible Fiberoptic Nasal Endoscopy (as per previous visit) Surgeon:  B. Senior, MD Anesthesia:  1% Lidocaine  Procedure Detail:  As a result of inability to visualize the intranasal anatomy, and after achieving adequate topical anesthesia and understanding the potential risks related to the procedure (primarily bleeding), a flexible fiberoptic endoscope is used to examine the left and right sinonasal cavities, including the interior of the nasal cavity, the septum,  and the middle and superior meatus, the turbinates, and the spheno-ethmoid recess. All these areas were inspected.  Findings: Left: SD that pushing against the IT, bulge on the anterior vestibule. MS is clear, some inflammation in MS,  There is some granulation around the natural ostium, some mucus actually at the granulation. Marked septations in the ethmoid. No pus, no polyp.  Right: Marked scar in the  ethmoid. Scar over the antrostomy, no pus, polyp, or crust noted. Septum is deviated towards the left. SER is clear, sphenoid at better position.   Justin Ochoa Nasal Endoscopy Score: [employer] is used to assess the degree of inflammation of the sinonasal structures, including the middle and superior turbinates, the ethmoid sinuses, maxillary sinuses, frontal sinuses, and sphenoid sinuses.  In the presence of previous surgery, some or all of these structures may be absent. 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 We again discussed physical exam and endoscopy findings with the patient thoroughly. He is scheduled for endoscopic sinus surgery as well as revision septoplasty on 03/14/2024. I do think that this is the appropriate thing for him and will give him the best outcome with best chance at reducing recurrence of sinusitis as well as improving his nasal airflow  and decreasing drainage. Unfortunately, he tells me that his insurance company continues to deny or MS surgery, that we have planned.  Unfortunately, I feel very strongly that this is required in order to give him a best outcome, particularly in light of the findings on endoscopy during previous visit with granulation and evidence of mucus buildup in his around the natural ostium. I guess, which is cleary evident today. In light of this and continue working with his insurance company. In the meantime, he will continue with his DDM rinses.   The sinus surgery itself was again discussed at length. Potential benefits of the procedure were discussed again, though no guarantee was made nor  implied. Alternatives of doing nothing versus ongoing medical therapy alone were also discussed again. The risks of the procedure were individually discussed in detail as including, but not limited to bleeding, infection, loss of sense of smell, double vision, blindness, CSF leak requiring other procedures to repair, numbness of teeth/and or face, need for revision surgery, brain/skull base injury, anesthetic risks and unexpected risks. The patient understands these risks and will proceed with surgery as they wish.  All questions were answered again.  Risk of septoplasty were discussed again with the patient including but not limited to bleeding, infection, septal perforation, numbness of the front two teeth, and loss of sense of smell. Unique concerns related to septal perforation were discussed again including mucus crusting, pain, whistling with breathing, and bleeding.  Challenges of management of septal perforation were reiterated. The patient understands these risks and wishes to proceed as discussed again.  The potential side effects of topical steroid use are extremely rare but include and are not limited to: increased appetite, insomnia, fluid retention, mood swings, weight gain, change in blood pressure, high blood glucose, possible adrenal suppression, osteoporosis, menstrual irregularities (if applicable), and cataracts. In rare cases need for hip replacement can occur.  Please notify physician's office of any concerns.   Follow-up after surgery or sooner for any concern.   I was present and participated in the entire office visit, examination including the entire endoscopy procedure (if performed).  I briefly reviewed with the patient that based on a new federal law, the patient will be provided with their laboratory, pathology and radiology results once they are finalized. Meaning, They will likely receive these results before the I have had a chance to review them independently. I reassured  them that we will work hard to respond back and counsel them on the results, though I asked that they allow for 5 business days for me to review the results and call them. If they have not heard within this time period, they have our contact information and know to call or message us  through MyChart.  In addition, my notes will also be available to them and may contain information that has not been thoroughly edited for them.   Scribe attestation: Thresa LABOR. Senior, MD obtained and performed the history, physical exam and medical decision making elements that were entered into the chart. Signed by Felisa Loosen, Medical Scribe, on February 21, 2024 at 10:08 AM.

## 2024-03-15 ENCOUNTER — Emergency Department (HOSPITAL_COMMUNITY)

## 2024-03-15 ENCOUNTER — Other Ambulatory Visit: Payer: Self-pay

## 2024-03-15 ENCOUNTER — Emergency Department (HOSPITAL_COMMUNITY)
Admission: EM | Admit: 2024-03-15 | Discharge: 2024-03-15 | Disposition: A | Discharge status: Home / Self Care | Attending: Emergency Medicine | Admitting: Emergency Medicine

## 2024-03-15 DIAGNOSIS — M79604 Pain in right leg: Secondary | ICD-10-CM

## 2024-03-15 DIAGNOSIS — M79651 Pain in right thigh: Secondary | ICD-10-CM | POA: Insufficient documentation

## 2024-03-15 DIAGNOSIS — M79661 Pain in right lower leg: Secondary | ICD-10-CM | POA: Diagnosis not present

## 2024-03-15 LAB — I-STAT CHEM 8, ED
BUN: 14 mg/dL (ref 6–20)
Calcium, Ion: 1.12 mmol/L — ABNORMAL LOW (ref 1.15–1.40)
Chloride: 103 mmol/L (ref 98–111)
Creatinine, Ser: 1 mg/dL (ref 0.61–1.24)
Glucose, Bld: 95 mg/dL (ref 70–99)
HCT: 46 % (ref 39.0–52.0)
Hemoglobin: 15.6 g/dL (ref 13.0–17.0)
Potassium: 3.6 mmol/L (ref 3.5–5.1)
Sodium: 141 mmol/L (ref 135–145)
TCO2: 25 mmol/L (ref 22–32)

## 2024-03-15 LAB — CBC WITH DIFFERENTIAL/PLATELET
Abs Immature Granulocytes: 0.03 K/uL (ref 0.00–0.07)
Basophils Absolute: 0 K/uL (ref 0.0–0.1)
Basophils Relative: 0 %
Eosinophils Absolute: 0 K/uL (ref 0.0–0.5)
Eosinophils Relative: 0 %
HCT: 45.3 % (ref 39.0–52.0)
Hemoglobin: 15.2 g/dL (ref 13.0–17.0)
Immature Granulocytes: 0 %
Lymphocytes Relative: 29 %
Lymphs Abs: 3 K/uL (ref 0.7–4.0)
MCH: 31.1 pg (ref 26.0–34.0)
MCHC: 33.6 g/dL (ref 30.0–36.0)
MCV: 92.8 fL (ref 80.0–100.0)
Monocytes Absolute: 0.8 K/uL (ref 0.1–1.0)
Monocytes Relative: 8 %
Neutro Abs: 6.4 K/uL (ref 1.7–7.7)
Neutrophils Relative %: 63 %
Platelets: 264 K/uL (ref 150–400)
RBC: 4.88 MIL/uL (ref 4.22–5.81)
RDW: 12.3 % (ref 11.5–15.5)
WBC: 10.3 K/uL (ref 4.0–10.5)
nRBC: 0 % (ref 0.0–0.2)

## 2024-03-15 LAB — D-DIMER, QUANTITATIVE: D-Dimer, Quant: <0.27 ug{FEU}/mL (ref 0.00–0.50)

## 2024-03-15 MED ORDER — CYCLOBENZAPRINE HCL 10 MG PO TABS
10.0000 mg | ORAL_TABLET | Freq: Once | ORAL | Status: DC
  Filled 2024-03-15: qty 1

## 2024-03-15 NOTE — ED Provider Notes (Signed)
 [facility]PARTMENT AT [facility] Provider Note   CSN: [id number] Arrival date & time: 03/15/24  1005     Patient presents with: Leg Pain   Bryden Darden is a 33 y.o. male.   Patient is a healthy 33 year old male who had a 3-hour sinus surgery yesterday and is presenting today with complaint of right leg pain.  He reports that after the surgery when waking up he did notice some pain in the back of his head and his right elbow as well as pain in his face.  However in the middle the night he started getting pain on the lateral right leg that has gradually worsened and is now radiating down into his right foot.  It is worse with palpation, movement and he reports with walking he even limps.  He has noticed some mild swelling in his leg.  Also some crampy pain at the back of the calf.  He has tried some Tylenol  but was told to avoid ibuprofen  due to the bleeding from the surgery.  He denies any significant shortness of breath.  No prior injury to that leg or history of clot.  The history is provided by the patient.  Leg Pain      Prior to Admission medications   Medication Sig Start Date End Date Taking? Authorizing Provider  cefdinir  (OMNICEF ) 300 MG capsule Take 1 capsule (300 mg total) by mouth 2 (two) times daily. 12/01/22   Blair Mt, MD  fexofenadine-pseudoephedrine (ALLEGRA-D 24) 180-240 MG 24 hr tablet Take 1 tablet by mouth daily.    [provider]  fluticasone (FLONASE) 50 MCG/ACT nasal spray Place into both nostrils daily as needed for allergies or rhinitis.    [provider]  HYDROcodone -acetaminophen  (NORCO/VICODIN) 5-325 MG tablet Take 1-2 tablets by mouth every 6 (six) hours as needed for moderate pain. 12/01/22   Blair Mt, MD  predniSONE  (STERAPRED UNI-PAK 21 TAB) 10 MG (21) TBPK tablet Sterapred DS 6 day taper. Take as directed. 12/01/22   Blair Mt, MD  pseudoephedrine (SUDAFED) 120 MG 12 hr tablet Take 120 mg by mouth  every 12 (twelve) hours as needed for congestion.    [provider]    Allergies: Doxycycline    Review of Systems  Updated Vital Signs BP (!) 125/90 (BP Location: Right Arm)   Pulse 88   Temp 98.2 F (36.8 C) (Oral)   Resp 17   Ht 5' 9 (1.753 m)   Wt 65.8 kg   SpO2 100%   BMI 21.41 kg/m   Physical Exam Vitals and nursing note reviewed.  Constitutional:      General: He is not in acute distress.    Appearance: He is well-developed.  HENT:     Head: Normocephalic and atraumatic.     Nose:     Comments: Nasal cavity is packed with some minimal blood noted on the bandages Eyes:     Conjunctiva/sclera: Conjunctivae normal.     Pupils: Pupils are equal, round, and reactive to light.  Cardiovascular:     Rate and Rhythm: Normal rate.     Pulses: Normal pulses.  Pulmonary:     Effort: Pulmonary effort is normal. No respiratory distress.  Musculoskeletal:        General: Tenderness present. Normal range of motion.     Cervical back: Normal range of motion and neck supple.       Legs:  Skin:    General: Skin is warm and dry.  Findings: No erythema or rash.  Neurological:     Mental Status: He is alert and oriented to person, place, and time. Mental status is at baseline.  Psychiatric:        Behavior: Behavior normal.     (all labs ordered are listed, but only abnormal results are displayed) Labs Reviewed  I-STAT CHEM 8, ED - Abnormal; Notable for the following components:      Result Value   Calcium, Ion 1.12 (*)    All other components within normal limits  CBC WITH DIFFERENTIAL/PLATELET  D-DIMER, QUANTITATIVE    EKG: None  Radiology: VAS US  LOWER EXTREMITY VENOUS (DVT) (7a-7p) Result Date: 03/15/2024  Lower Venous DVT Study Patient Name:  Conner Bernabe  Date of Exam:   03/15/2024 Medical Rec #: [id number]     Accession #:    [id number] Date of Birth: 1991-08-27     Patient Gender: M Patient Age:   37 years Exam Location:  [facility]  Procedure:      VAS US  LOWER EXTREMITY VENOUS (DVT) Referring Phys: AMY LOWTHER --------------------------------------------------------------------------------  Indications: Lateral thigh pain.  Comparison Study: No previous exams on file Performing Technologist: Jody Hill RVT, RDMS  Examination Guidelines: A complete evaluation includes B-mode imaging, spectral Doppler, color Doppler, and power Doppler as needed of all accessible portions of each vessel. Bilateral testing is considered an integral part of a complete examination. Limited examinations for reoccurring indications may be performed as noted. The reflux portion of the exam is performed with the patient in reverse Trendelenburg.  +---------+---------------+---------+-----------+----------+--------------+ RIGHT    CompressibilityPhasicitySpontaneityPropertiesThrombus Aging +---------+---------------+---------+-----------+----------+--------------+ CFV      Full           Yes      Yes                                 +---------+---------------+---------+-----------+----------+--------------+ SFJ      Full                                                        +---------+---------------+---------+-----------+----------+--------------+ FV Prox  Full           Yes      Yes                                 +---------+---------------+---------+-----------+----------+--------------+ FV Mid   Full           Yes      Yes                                 +---------+---------------+---------+-----------+----------+--------------+ FV DistalFull           Yes      Yes                                 +---------+---------------+---------+-----------+----------+--------------+ PFV      Full                                                        +---------+---------------+---------+-----------+----------+--------------+  POP      Full           Yes      Yes                                  +---------+---------------+---------+-----------+----------+--------------+ PTV      Full                                                        +---------+---------------+---------+-----------+----------+--------------+ PERO     Full                                                        +---------+---------------+---------+-----------+----------+--------------+   +----+---------------+---------+-----------+----------+--------------+ LEFTCompressibilityPhasicitySpontaneityPropertiesThrombus Aging +----+---------------+---------+-----------+----------+--------------+ CFV Full           Yes      Yes                                 +----+---------------+---------+-----------+----------+--------------+     Summary: RIGHT: - There is no evidence of deep vein thrombosis in the lower extremity.  - No cystic structure found in the popliteal fossa.  LEFT: - No evidence of common femoral vein obstruction.   *See table(s) above for measurements and observations.    Preliminary      Procedures   Medications Ordered in the ED  cyclobenzaprine  (FLEXERIL ) tablet 10 mg (10 mg Oral Not Given 03/15/24 1247)                                    Medical Decision Making Amount and/or Complexity of Data Reviewed Labs: ordered. Decision-making details documented in ED Course. Radiology: ordered and independent interpretation performed. Decision-making details documented in ED Course.  Risk Prescription drug management.   Patient presenting today with pain in the lateral right thigh.  No obvious trauma noted.  However concern for possible deep hematoma with some nerve irritation or musculoskeletal issue.  Also possibility for DVT however feel less likely due to the location of the pain, no medial thigh pain or evidence of leg swelling.  However patient did have a 3-hour surgery yesterday.  He takes no anticoagulation or other medications.  No significant shortness of breath.  Pulses are intact  with low suspicion for ischemia.  Will also ensure no electrolyte abnormality as he does complain of a cramping sensation.  I independently interpreted patient's labs and CBC within normal limits, Chem-8 without acute findings. Doppler is pending.  1:08 PM I have independently visualized and interpreted pt's images today.  Ultrasound is negative for DVT.  Radiology reports no cystic structures noted.  At this time we will treat for musculoskeletal pain and have patient return for worsening symptoms.      Final diagnoses:  Right leg pain    ED Discharge Orders     None          Doretha Folks, MD 03/15/24 1308

## 2024-03-15 NOTE — Discharge Instructions (Signed)
 Blood work and ultrasound look normal.  You can try muscle rubs, ongoing stretching and the pain medication you have.  I would expect that should start improving in the next few days.

## 2024-03-15 NOTE — Progress Notes (Signed)
 RLE venous duplex has been completed.  Preliminary results given to Dr. Doretha.   Results can be found under chart review under CV PROC. 03/15/2024 12:05 PM Marisah Laker RVT, RDMS

## 2024-03-15 NOTE — ED Notes (Signed)
 No acute distress. Pt complains of R thigh pain that started last night. No redness noted. Pt thinks his R foot is swollen. No pitting on exam. Full mobility, notes pain 3/10 with walking. Denies any shob, chest pain, or dizziness.

## 2024-03-15 NOTE — ED Provider Triage Note (Signed)
 Emergency Medicine Provider Triage Evaluation Note  Romell Wolden , a 33 y.o. male  was evaluated in triage.  Pt complains of right thigh pain after sinus surgery.  Review of Systems  Positive: Leg pain Negative: SOB  Physical Exam  BP (!) 125/90 (BP Location: Right Arm)   Pulse 88   Temp 98.2 F (36.8 C) (Oral)   Resp 17   Ht 5' 9 (1.753 m)   Wt 65.8 kg   SpO2 100%   BMI 21.41 kg/m  Gen:   Awake, no distress   Resp:  Normal effort  MSK:   Moves extremities without difficulty  Other:    Medical Decision Making  Medically screening exam initiated at 10:40 AM.  Appropriate orders placed.  Darold Miley was informed that the remainder of the evaluation will be completed by another provider, this initial triage assessment does not replace that evaluation, and the importance of remaining in the ED until their evaluation is complete.     Analysa Nutting, DO 03/15/24 1040

## 2024-03-15 NOTE — ED Notes (Signed)
 Pt requesting to have US  done before taking Flexeril . MD notified.

## 2024-03-15 NOTE — ED Triage Notes (Signed)
 Pt. Stated, I had sinus surgery yesterday and last night I started having right thigh area and can feel it all the way to my toes.

## 2024-07-10 ENCOUNTER — Encounter: Payer: Self-pay | Admitting: Radiology
